# Patient Record
Sex: Female | Born: 1980 | Race: White | Hispanic: No | Marital: Married | Smoking: Former smoker
Health system: Southern US, Community
[De-identification: ages and names within clinical notes are randomized; demographics above are authoritative.]

## PROBLEM LIST (undated history)

## (undated) DIAGNOSIS — E039 Hypothyroidism, unspecified: Secondary | ICD-10-CM

## (undated) DIAGNOSIS — R569 Unspecified convulsions: Secondary | ICD-10-CM

## (undated) DIAGNOSIS — G4700 Insomnia, unspecified: Secondary | ICD-10-CM

## (undated) DIAGNOSIS — Z87442 Personal history of urinary calculi: Secondary | ICD-10-CM

## (undated) DIAGNOSIS — R519 Headache, unspecified: Secondary | ICD-10-CM

## (undated) DIAGNOSIS — C801 Malignant (primary) neoplasm, unspecified: Secondary | ICD-10-CM

## (undated) DIAGNOSIS — I499 Cardiac arrhythmia, unspecified: Secondary | ICD-10-CM

## (undated) DIAGNOSIS — I10 Essential (primary) hypertension: Secondary | ICD-10-CM

## (undated) DIAGNOSIS — E2749 Other adrenocortical insufficiency: Secondary | ICD-10-CM

## (undated) DIAGNOSIS — K219 Gastro-esophageal reflux disease without esophagitis: Secondary | ICD-10-CM

## (undated) DIAGNOSIS — J189 Pneumonia, unspecified organism: Secondary | ICD-10-CM

## (undated) HISTORY — PX: ABDOMINAL HYSTERECTOMY: SHX81

## (undated) HISTORY — PX: LEEP: SHX91

## (undated) HISTORY — DX: Hypothyroidism, unspecified: E03.9

## (undated) HISTORY — DX: Essential (primary) hypertension: I10

---

## 1999-11-05 ENCOUNTER — Ambulatory Visit: Admission: RE | Admit: 1999-11-05 | Discharge: 1999-11-05 | Payer: Self-pay | Admitting: Gynecologic Oncology

## 2001-01-19 ENCOUNTER — Encounter: Payer: Self-pay | Admitting: Internal Medicine

## 2001-01-19 ENCOUNTER — Ambulatory Visit (HOSPITAL_COMMUNITY): Admission: RE | Admit: 2001-01-19 | Discharge: 2001-01-19 | Payer: Self-pay | Admitting: Internal Medicine

## 2001-06-30 ENCOUNTER — Encounter: Payer: Self-pay | Admitting: *Deleted

## 2001-06-30 ENCOUNTER — Ambulatory Visit (HOSPITAL_COMMUNITY): Admission: RE | Admit: 2001-06-30 | Discharge: 2001-06-30 | Payer: Self-pay | Admitting: *Deleted

## 2001-09-21 ENCOUNTER — Ambulatory Visit (HOSPITAL_COMMUNITY): Admission: AD | Admit: 2001-09-21 | Discharge: 2001-09-21 | Payer: Self-pay | Admitting: *Deleted

## 2001-09-22 ENCOUNTER — Ambulatory Visit (HOSPITAL_COMMUNITY): Admission: RE | Admit: 2001-09-22 | Discharge: 2001-09-22 | Payer: Self-pay | Admitting: *Deleted

## 2001-10-25 ENCOUNTER — Inpatient Hospital Stay (HOSPITAL_COMMUNITY): Admission: AD | Admit: 2001-10-25 | Discharge: 2001-10-26 | Payer: Self-pay | Admitting: *Deleted

## 2002-04-21 HISTORY — PX: LEEP: SHX91

## 2002-08-25 ENCOUNTER — Other Ambulatory Visit: Admission: RE | Admit: 2002-08-25 | Discharge: 2002-08-25 | Payer: Self-pay | Admitting: *Deleted

## 2002-09-22 ENCOUNTER — Emergency Department (HOSPITAL_COMMUNITY): Admission: EM | Admit: 2002-09-22 | Discharge: 2002-09-23 | Payer: Self-pay | Admitting: Emergency Medicine

## 2002-09-22 ENCOUNTER — Other Ambulatory Visit: Admission: RE | Admit: 2002-09-22 | Discharge: 2002-09-22 | Payer: Self-pay | Admitting: *Deleted

## 2003-06-10 ENCOUNTER — Emergency Department (HOSPITAL_COMMUNITY): Admission: EM | Admit: 2003-06-10 | Discharge: 2003-06-10 | Payer: Self-pay | Admitting: Emergency Medicine

## 2003-09-28 ENCOUNTER — Other Ambulatory Visit: Admission: RE | Admit: 2003-09-28 | Discharge: 2003-09-28 | Payer: Self-pay | Admitting: Obstetrics and Gynecology

## 2004-07-05 ENCOUNTER — Other Ambulatory Visit: Admission: RE | Admit: 2004-07-05 | Discharge: 2004-07-05 | Payer: Self-pay | Admitting: Obstetrics and Gynecology

## 2005-01-20 ENCOUNTER — Ambulatory Visit (HOSPITAL_COMMUNITY): Admission: RE | Admit: 2005-01-20 | Discharge: 2005-01-20 | Payer: Self-pay | Admitting: Family Medicine

## 2007-04-02 ENCOUNTER — Ambulatory Visit (HOSPITAL_COMMUNITY): Admission: RE | Admit: 2007-04-02 | Discharge: 2007-04-02 | Payer: Self-pay | Admitting: Family Medicine

## 2010-09-06 NOTE — Discharge Summary (Signed)
[name], [name]                       ACCOUNT NO.:  [id number]   MEDICAL RECORD NO.:  [id number]                   PATIENT TYPE:  INP   LOCATION:  A416                                 FACILITY:  [facility]   PHYSICIAN:  Roylene Reason. Lisette Grinder, M.D.             DATE OF BIRTH:  Sep 26, 1980   DATE OF ADMISSION:  10/25/2001  DATE OF DISCHARGE:  10/26/2001                                 DISCHARGE SUMMARY   HISTORY OF PRESENT ILLNESS:  This is a 30 year old gravida 2, para 1 at [redacted]  weeks gestation who was seen in the office today complaining of preterm  labor and preterm uterine contractions.  She was referred to [facility] for evaluation and continued fetal monitoring.  The patient's  prenatal course had been otherwise uncomplicated with no prior history of  preterm labor, no prior history of premature cervical dilatation.  The  patient denies any vaginal discharge, any leakage of fluid.  She describes  good uterine activity.   PAST OB HISTORY:  Pertinent for one prior vaginal delivery without  complications.   PHYSICAL EXAMINATION:  GENERAL:  No acute distress.  VITAL SIGNS:  Temperature 98.5, pulse 90, respirations 20, blood pressure  105/60.  PELVIC:  Uterus is soft and nontender.  Fundal height is 34 cm.  She has  vertex presentation by Leopold's maneuvers.  No uterine tenderness is  solicited.  Normal external genitalia.  No lesions or ulcerations  identified.  No vaginal bleeding.  No leakage of fluid.  Cervix noted to be  2 cm dilated, -2 station, about 60% effaced.  EXTREMITIES:  Normal.   LABORATORIES:  External fetal monitor reveals only presence of uterine  irritability with very short duration uterine contractions of 15-30 seconds'  duration occurring very irregularly and very mild to palpation.  Fetal heart  rate is noted to be 140-150 with normal long-term variability.  Fetal heart  rate accelerations are noted.  No fetal heart rate decelerations are noted.  Reactive nonstress test, uterine irritability only.   ASSESSMENT/PLAN:  A [redacted] week gestation, preterm labor with premature cervical  dilatation.  The patient is restricted at this time to bed rest.  She has  not been administered any tocolytic medications but due to risk of preterm  delivery she is treated with 12 mg of IM betamethasone to be repeated in 24  hours' duration.  The patient is given strict instructions to follow up for  onset of vaginal bleeding or onset of any significant uterine activity  differing from what she appreciates right now.                                               Donald P. Lisette Grinder, M.D.    DPC/MEDQ  D:  12/05/2001  T:  12/06/2001  Job:  [id number]

## 2010-09-06 NOTE — Op Note (Signed)
[name], [name] NO.:  [id number]   MEDICAL RECORD NO.:  [id number]                   PATIENT TYPE:  EMS   LOCATION:  ED                                   FACILITY:  [facility]   PHYSICIAN:  Langley Gauss, M.D.                DATE OF BIRTH:  08/20/1980   DATE OF PROCEDURE:  09/23/2002  DATE OF DISCHARGE:                                 OPERATIVE REPORT   PREOPERATIVE DIAGNOSIS:  Vaginal bleeding status post LEEP.   POSTOPERATIVE DIAGNOSIS:  Vaginal bleeding status post LEEP.   PROCEDURE:  Examination of the cervix with placement of ligature sutures at  3 and 9 o'clock followed by cauterization of the bed of the cervix.   SURGEON:  Roylene Reason. Lisette Grinder, M.D.   ANESTHESIA:  Paracervical block placed by Dr. Roylene Reason. Lisette Grinder utilizing a  total of 20 cc of 0.5% bupivacaine with epinephrine placed at 2, 4, 8, and  10 o'clock.  In addition, the patient received LMA per anesthesia with  excellent results.   ESTIMATED BLOOD LOSS:  About 100 cc of clots are evacuated from the vaginal  vault after removal of the tampon.   OPERATIVE PROCEDURE:  The patient was taken to the operating room.  Vital  signs were stable.  The patient received appropriate IV sedation.  She was  placed in the lithotomy position, prepped and draped in the usual sterile  manner. Tampon is removed which is noted to be saturated with blood.  Clots  were removed from behind the tampon. The cervix is well-visualized.  The  patient has been sterilely prepped and draped utilizing Betadine solution.  The cervix is visualized.  The bed of the LEEP procedure is noted.  There  was noted to be a small amount of active bleeding occurring, but not any  excessive arterial bleeding noted.  At this point in time, #0 Vicryl pop-off  sutures are used to place a ligatory suture at 3 o'clock and at 9 o'clock on  the cervix to control major arterial supply.  Following the injection of the  bupivacaine  and epidural and placement of the ligatory sutures there is  noted to be minimal active bleeding.  The vascular bed is carefully examined  where the LEEP procedure had been performed and cauterization is performed  of the entire bed of the LEEP.  The endocervical canal is probed and noted  to be patent.  After bleeding is noted to be completely controlled with no  additional bleeding, the procedure is then terminated.  No Monsel's or  packing solution are placed.  The patient is, thereafter, taken to recovery  room on the second floor in stable condition.  Operative findings are  discussed with the patient's awaiting family.  No active bleeding is noted  to be occurring.  Vital signs are stable.  The patient is given the offer of  staying overnight versus being  discharged home, at this time with the  family.  The patient has elected to be discharged home with the family at  this time.                                               Langley Gauss, M.D.    DC/MEDQ  D:  09/23/2002  T:  09/23/2002  Job:  [id number]

## 2010-09-06 NOTE — H&P (Signed)
[facility]  Patient:    [name], [name] Visit Number: [id number] MRN: [id number]          Service Type: OBS Location: 4A A416 01 Attending Physician:  Jeri Cos. Dictated by:   Langley Gauss, M.D. Admit Date:  10/25/2001                           History and Physical  HISTORY OF PRESENT ILLNESS:  A 30 year old, Gravida 2, Para 1 at [redacted] weeks gestation was seen in the office for a scheduled visit today at which time, she was noted to be in prodromal labor with symptoms of uterine contractions as well as increased vaginal discharge.  The patients prenatal course has been uncomplicated.  Glucose tolerance test is normal at 124.  Rubella immune. A positive blood type.  She has had serial ultrasounds which have documented normal anatomic survey and adequate fetal growth.  PAST MEDICAL HISTORY:  She has one prior vaginal delivery on June 02, 1997, 6 pound 6 ounce infant.  No other medical or surgical history.  ALLERGIES:  She has no known drug allergies.  SOCIAL HISTORY:  The patient did smoke one pack per day at the onset of the pregnancy.  Father of the baby is named Elfida Shimada, works [employer]. The patient herself was employed at [employer].  The patient does plan bottle feeding.  She will be utilizing pediatrician on call.  She has not made any definitive statements regarding birth control options postpartum.  PHYSICAL EXAMINATION:  GENERAL:  Pleasant white female.  VITAL SIGNS:  Height is 5 feet 4 inches.  Prepregnancy weight 123, todays weight 148.  Blood pressure 124/76, pulse rate 80, respiratory rate 20.  HEENT:  Negative.  No adenopathy.  NECK:  Supple.  Thyroid not palpable.  LUNGS:  Clear.  CARDIOVASCULAR:  Regular rate and rhythm.  ABDOMEN:  Soft and nontender.  No surgical scars are identified.  She is vertex presentation by Leopolds maneuvers.  EXTREMITIES:  Normal with only trace  pretibial edema.  PELVIC:  Normal external genitalia.  No lesions or ulcerations identified.  No leakage of fluid or vaginal bleeding.  Cervix 4 cm dilated, 80% effaced, 0 station with vertex presentation confirmed. Fetal heart tones are auscultated in the 150s.  ASSESSMENT:  A 38-week intrauterine pregnancy in prodromal labor.  The patient referred to [facility] at this time for admission and amniotomy. Notably the patient does give vague history that prior infant was treated for group B Strep times 48 hours postpartum.  Thus patient to be treated during this course in labor with IV Ampicillin empirically. Dictated by:   Langley Gauss, M.D. Attending Physician:  Jeri Cos. DD:  10/25/01 TD:  10/25/01 Job: [id number]

## 2010-09-06 NOTE — Discharge Summary (Signed)
   NAMEBLOSSOM, CRUME NO.:  [id number]   MEDICAL RECORD NO.:  [id number]                   PATIENT TYPE:  EMS   LOCATION:  ED                                   FACILITY:  [facility]   PHYSICIAN:  Langley Gauss, M.D.                DATE OF BIRTH:  09-14-1980   DATE OF ADMISSION:  09/22/2002  DATE OF DISCHARGE:                                 DISCHARGE SUMMARY   The patient discharged to home from the recovery room on September 23, 2002.  She  was given instructions to follow up in the office in 1 week's time at which  time pathology results will be available. She is advised that some light  vaginal bleeding may occur for which she can use pads or tampons.   She does have a prescription for Vicoprofen for dysmenorrhea which she can  take, as needed, for any uterine cramping which may occur.  The patient can  return to work in 3 days time without restrictions. She is advised no  intercourse x1 weeks time.   HOSPITAL COURSE:  See previous dictations.  The patient initially evaluated  in the emergency room on September 22, 2002 and taken to the operating room where  the described procedure was performed on September 23, 2002.  Postoperatively the  patient did well.  She was discharged home September 23, 2002.                                               Langley Gauss, M.D.    DC/MEDQ  D:  09/23/2002  T:  09/23/2002  Job:  [id number]

## 2010-09-06 NOTE — H&P (Signed)
[name], NGU NO.:  [id number]   MEDICAL RECORD NO.:  [id number]                   PATIENT TYPE:  EMS   LOCATION:  ED                                   FACILITY:  [facility]   PHYSICIAN:  Langley Gauss, M.D.                DATE OF BIRTH:  17-Mar-1981   DATE OF ADMISSION:  09/22/2002  DATE OF DISCHARGE:                                HISTORY & PHYSICAL   HISTORY OF PRESENT ILLNESS:  The patient is evaluated and initially seen in  the emergency room on September 22, 2002.  Arrangements are made to take the  patient to surgery.  On September 22, 2002, a 30 year old gravida 3, para 3 with  complaints of vaginal bleeding post LEEP procedure today.  The patient had  had the LEEP procedure performed in the a.m. at which time there is  difficulty controlling bleeding.  The patient did have a vaginal packing  placed, was discharged to home with return visit in the office that  afternoon.  When the packing was removed in the afternoon, she was noted to  have no significant active bleeding and several small clots were evacuated,  and a tampon with Monsel solution in place.  The patient was advised to  leave this in until tonight at which time it could be removed and a new  tampon placed.  However, the patient states that after arriving at home she  saturated two pads through the tampon, began feeling cool, clammy and shaky,  somewhat unsteady on her feet.  Her sister-in-law is a nurse who evaluated  the patient.  There was noted, the impression of the patient and her family,  a large amount of bleeding.  The patient was noted to be sitting on the  bathroom floor in a pool of blood.  Thus, EMS was contacted and transported  the patient to the [facility] emergency room.   ALLERGIES:  No known drug allergies.   PAST MEDICAL HISTORY:  Three prior vaginal deliveries without complications.  Most recently Pap smear could not rule out high grade SIL, thus, indications  for the  LEEP procedure.   REVIEW OF SYSTEMS:  Pertinent for no prior bleeding disorders.  No blood  dyscrasia.  No easy bruisability.   PHYSICAL EXAMINATION:  GENERAL APPEARANCE:  The patient is in mild distress,  particularly the family feeling that there has been excessive complications  associated with the procedure.  VITAL SIGNS:  Blood pressure 107/64, pulse 66, respirations 22, temperature  99.9.  HEENT:  Mucous membranes are moist.  Thyroid is not palpable.  NECK:  Supple.  LUNGS:  Clear.  CARDIOVASCULAR:  Regular rate and rhythm.  ABDOMEN:  Soft and nontender.  No abdominal pelvic masses appreciated.  No  abdominal distention.  Pelvis examination has moderate amount of external  vaginal bleeding noted on the inner thighs.  The tampon from previous  procedure is  currently in place.   LABORATORY DATA:  Hemoglobin obtained in the emergency room 11.7, hematocrit  32.4, white count 16.3.   ASSESSMENT:  Patient with renewed problem with vaginal bleeding status post  loop electrocautery excision procedure.  Packing has already been tried in  the office, has been unsuccessful.  At this point in time, the patient is  to be taken to the operating room where better exposure can be obtained and  sutures placed paracervically to control the major arterial supply to the  cervix itself.  The patient and family agreed to proceed with having this  done rather than leaving against medical advise, and this will be performed  this p.m.                                                Langley Gauss, M.D.    DC/MEDQ  D:  09/23/2002  T:  09/23/2002  Job:  [id number]

## 2010-09-06 NOTE — H&P (Signed)
[facility]  Patient:    [name], [name]                      MRN: [id number] Adm. Date:  11/05/99 Attending:  Jonny Ruiz T. Kyla Balzarine, M.D. CC:         Dr. Melany Guernsey, [address],             [place],  [place]  [address]             [facility], [address], [place] [address]             Telford Nab, R.N.                         History and Physical  HISTORY OF PRESENT ILLNESS:  Ms. [name] is a 30 year old para 1, Caucasian woman, self-referred for second opinion regarding management of cervical dysplasia.  The patient had a Pap smear obtained in April compatible with high-grade SIL, moderate to severe dysplasia.  She was evaluated by Dr. 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 Magdalene River recommended serviellance at six-month intervals.  The patient denies genital condylomata or STDs. She is a tobacco smoker.  PAST MEDICAL HISTORY:  No major comorbidities.  NSVD x 1.  MEDICATIONS:  Depo-Provera.  ALLERGIES:  None.  PERSONAL/SOCIAL HISTORY:  Married.  One pack per day tobacco.  Occasional ethanol.  FAMILY MEDICAL HISTORY:  Maternal aunt with breast cancer but no other gynecologic or colon malignancy.  REVIEW OF SYSTEMS:  Negative for significant pelvic, GI, or GU problems.  The patient has been amenorrheic since beginning Depo-Provera.  PHYSICAL EXAMINATION:  VITAL SIGNS:  Blood pressure 90/68.  Weight 123 pounds and vital signs stable.  GENERAL:  The patient is alert and oriented x 3.  ABDOMEN:  Soft and benign.  EXTREMITIES:  Mobile without edema.  PELVIC EXAM:  External genitalia and BUS were normal to inspection and palpation.  The bladder and urethra well supported.  The cervix has no gross lesions and is mobile without tenderness.  Bimanual and rectovaginal examinations reveal no  parametrial induration, normal uterus and adnexal structures.  Colposcopy of the upper vagina and cervix was performed revealing a wide eversion with scattered islands of acido-white epithelium with scattered punctation but no vascular features suggesting high-grade dysplasia.  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  PLAN:  I had a lengthy discussion with the patient and her mother regarding cervical dysplasia.  The histologic diagnosis description is much more compatible with CIN-I than moderate to severe dysplasia and these lesions have a 70% chance of progression without any further intervention.  I recommended that the patient pursue smoking cessation.  She should have cytology followed at approximately six month intervals and I would repeat colposcopy should her Pap smear suggest worsening dysplasia or persistence of low-grade disease over more than two years.  Because her most recent Pap smear suggested high-grade SIL, I would probably move to a LEEP biopsy if subsequent Pap smears continued to suggest HGSIL. DD:  11/05/99 TD:  11/05/99 Job: [id number]

## 2010-09-06 NOTE — Discharge Summary (Signed)
[facility]  Patient:    [name], [name] Visit Number: [id number] MRN: [id number]          Service Type: OBS Location: 4A A416 01 Attending Physician:  Jeri Cos. Dictated by:   Langley Gauss, M.D. Admit Date:  10/25/2001 Discharge Date: 10/26/2001                             Discharge Summary  DISCHARGE DIAGNOSIS:  The patient is bottle feeding.  FOLLOWUP:  She will follow up in the office in four weeks time.  PERTINENT LABORATORY DATA:  Has A positive blood type. Admission hemoglobin and hematocrit of 9.4/26.3 with a white count of 11.6. Postpartum day #1, hemoglobin 9.2, hematocrit 25.7 with a white count of 18.0.  DISCHARGE INSTRUCTIONS:  The patient is given a copy of our standardized discharge instructions at the time of discharge.  DISCHARGE MEDICATIONS:  None.  In addition, infant circumcision is performed on October 26, 2001 or day of discharge.  HOSPITAL COURSE:  See previous dictations. The patient delivered very rapidly on October 25, 2001 without incident. Postpartum she did very well. She bonded well with the infant, had no postpartum complications, had excellent family support. Thus, the patient and infant are discharged on October 26, 2001, given a copy of the standardized discharge instructions. Dictated by:   Langley Gauss, M.D. Attending Physician:  Jeri Cos. DD:  10/29/00 TD:  11/01/01 Job: [id number]

## 2010-09-06 NOTE — Op Note (Signed)
[facility]  Patient:    [name], [name] Visit Number: [id number] MRN: [id number]          Service Type: OBS Location: 4A A416 01 Attending Physician:  Jeri Cos. Dictated by:   Langley Gauss, M.D. Admit Date:  10/25/2001 Discharge Date: 10/26/2001                             Operative Report  DATE OF DELIVERY: October 25, 2001  DIAGNOSES:  38-week intrauterine pregnancy for induction of labor.  DELIVERY FORM:  A spontaneous assisted vaginal delivery, 6 lb, 8.9 oz., female infant delivered over a midline episiotomy.  FINDINGS AT TIME OF DELIVERY:  Include a nuchal cord x2 without compression. Delivery performed by Dr. Roylene Reason. Lisette Grinder.  ESTIMATED BLOOD LOSS:  Less than 500 cc.  COMPLICATIONS:  None.  SPECIMENS:  Arterial cord gas and cord blood to pathology laboratory for analysis.  The placenta was noted to separate and deliver spontaneously.  It was examined and noted to be apparently intact with a three-vessel umbilical cord.  ANALGESIA FOR DELIVERY: The patient received only 30 cc of 1% lidocaine in the midline of the perineal body.  SUMMARY:  The patient at 38 weeks with a favorable cervix.  Was admitted for induction of labor after amniotomy was performed.  Clear amniotic fluid was noted.  Fetal scalp electrode was placed, which documented a reassuring fetal heart rate.  Thereafter, the patient progressed very rapidly along the labor curve to complete dilatation, after which time she had a strong urge to push. She was placed in the dorsolithotomy position, prepped and draped in the usual sterile manner.  The patient pushed very well during the short second stage of labor and delivered in a direct OA position with distention of the perineum. A small midline episiotomy had been performed.  The infant delivered in a direct OA position over this midline episiotomy without extension.  The nuchal cord x2 was reduced without  difficulty.  Renewed expulsive efforts resulted in spontaneous rotation to a left anterior shoulder position, gentle downward traction, combined with expulsive efforts resulted in delivery of the anterior shoulder without difficulty, as well as the remainder of the infant without difficulty.  A spontaneous, vigorous, breathe and cry was noted.  The umbilical cord was removed towards the infant.  The cord was doubly clamped and cut, and the infant was handed to the awaiting nursing staff for assessment.  Arterial cord gas and cord blood were then obtained.  Gentle traction on the umbilical cord resulted in separation, which upon examination appears to be the intact three-vessel placenta.  Excellent uterine tone was achieved following delivery, utilizing a bimanual massage of the uterus. Examination of the genital tract revealed no lacerations.  Specifically, the cervix and periurethral area was noted to be intact.  The midline episiotomy was noted to have not extended.  This was easily repaired utilizing 0 chromic in a running locked fashion of the vaginal mucosa, followed by a two-layer closure with 0 chromic on the perineal body.  The patient tolerated the delivery very well.  She was taken out of the dorsolithotomy position and allowed to bond with the infant. Dictated by:   Langley Gauss, M.D. Attending Physician:  Jeri Cos. DD:  10/29/01 TD:  11/01/01 Job: [id number]

## 2010-11-29 ENCOUNTER — Ambulatory Visit (HOSPITAL_COMMUNITY)
Admission: RE | Admit: 2010-11-29 | Discharge: 2010-11-29 | Disposition: A | Discharge status: Home / Self Care | Payer: 59 | Source: Ambulatory Visit | Attending: Family Medicine | Admitting: Family Medicine

## 2010-11-29 ENCOUNTER — Other Ambulatory Visit (HOSPITAL_COMMUNITY): Payer: Self-pay | Admitting: Family Medicine

## 2010-11-29 DIAGNOSIS — J069 Acute upper respiratory infection, unspecified: Secondary | ICD-10-CM

## 2010-11-29 DIAGNOSIS — R0602 Shortness of breath: Secondary | ICD-10-CM | POA: Insufficient documentation

## 2010-11-29 DIAGNOSIS — R05 Cough: Secondary | ICD-10-CM

## 2010-11-29 DIAGNOSIS — R059 Cough, unspecified: Secondary | ICD-10-CM | POA: Insufficient documentation

## 2013-05-13 ENCOUNTER — Ambulatory Visit (HOSPITAL_COMMUNITY)
Admission: RE | Admit: 2013-05-13 | Discharge: 2013-05-13 | Disposition: A | Discharge status: Home / Self Care | Payer: 59 | Source: Ambulatory Visit | Attending: Family Medicine | Admitting: Family Medicine

## 2013-05-13 ENCOUNTER — Other Ambulatory Visit (HOSPITAL_COMMUNITY): Payer: Self-pay | Admitting: Family Medicine

## 2013-05-13 DIAGNOSIS — R0609 Other forms of dyspnea: Secondary | ICD-10-CM | POA: Insufficient documentation

## 2013-05-13 DIAGNOSIS — R042 Hemoptysis: Secondary | ICD-10-CM

## 2013-05-13 DIAGNOSIS — R002 Palpitations: Secondary | ICD-10-CM | POA: Insufficient documentation

## 2013-05-13 DIAGNOSIS — R0989 Other specified symptoms and signs involving the circulatory and respiratory systems: Principal | ICD-10-CM | POA: Insufficient documentation

## 2013-12-05 NOTE — H&P (Signed)
[name], [name]             ACCOUNT NO.:  [id number]  MEDICAL RECORD NO.:  [id number]  LOCATION:                                 FACILITY:  PHYSICIAN:  Ralene Bathe. Matthew Hughes, [name].DATE OF BIRTH:  1980-08-22  DATE OF ADMISSION:  12/12/2013 DATE OF DISCHARGE:                             HISTORY & PHYSICAL   CHIEF COMPLAINT:  Dysmenorrhea and menorrhagia.  HPI:  A 33 year old, G2, P2, currently using condoms, this patient was evaluated for menorrhagia in 2014, with sonohysterogram in our office that showed a retroflexed uterus.  Thickened myometrium consistent with probable adenomyosis and also a 5-mm polypoid mass noted within the fundal cavity.  At that time, we had discussed a number of treatment options, all of which she had declined, but over the last year, she has developed increasing pain with her period and increased menstrual flow. She presents now for LAVH, bilateral salpingectomy.  This procedure including specific risks related to bleeding infection, adjacent organ injury, the possible need to complete surgery by open technique, all discussed with her.  The rationale for salpingectomy at the time of hysterectomy to reduce later life risk of ovarian cancer discussed with her also.  Note that she has had LEEP in the past.  Her last Pap August 15th was normal.  PAST MEDICAL HISTORY:  Allergies:  ALBUTEROL.  CURRENT MEDICATIONS:  Lamictal, Lopressor, Ambien p.r.n.  OBSTETRICAL HISTORY:  Two vaginal deliveries.  REVIEW OF SYSTEMS:  Significant for prior history of abnormal Pap, history of UTI, hypertension.  Barbara Grills, MD is her PCP.  SOCIAL HISTORY:  Denies alcohol, tobacco, or drug use.  She is married.  PHYSICAL EXAMINATION:  VITAL SIGNS:  Temp 98.2, blood pressure 118/80. HEENT:  Unremarkable. NECK:  Supple without masses. LUNGS:  Clear. CARDIOVASCULAR:  Regular rate and rhythm without murmurs, rubs, gallops sounds. BREASTS:  Without  masses. ABDOMEN:  Soft, flat, nontender. GENITALIA:  Vulva, vagina, cervix normal.  Uterus mid position, normal size.  Adnexa negative. EXTREMITIES:  Unremarkable. NEUROLOGIC:  Unremarkable.  IMPRESSION: 1. Dysmenorrhea/menorrhagia. 2. History of by ultrasound rather possible adenomyosis, small     endometrial polyp. 3. History of loop electrosurgical excision procedure in the past.  PLAN:  LAVH bilateral salpingectomy. Procedure and risks discussed as above.     [name], [name].     RMH/MEDQ  D:  12/05/2013  T:  12/05/2013  Job:  [id number]

## 2013-12-05 NOTE — H&P (Signed)
[facility]  DICTATION # [phone] CSN# [id number]   Margarette Asal, MD 12/05/2013 9:21 AM

## 2013-12-09 ENCOUNTER — Encounter (HOSPITAL_COMMUNITY)
Admission: RE | Admit: 2013-12-09 | Discharge: 2013-12-09 | Disposition: A | Discharge status: Home / Self Care | Payer: 59 | Source: Ambulatory Visit | Attending: Obstetrics and Gynecology | Admitting: Obstetrics and Gynecology

## 2013-12-09 ENCOUNTER — Encounter (HOSPITAL_COMMUNITY): Payer: Self-pay

## 2013-12-09 DIAGNOSIS — Z87891 Personal history of nicotine dependence: Secondary | ICD-10-CM | POA: Diagnosis not present

## 2013-12-09 DIAGNOSIS — N946 Dysmenorrhea, unspecified: Secondary | ICD-10-CM | POA: Diagnosis not present

## 2013-12-09 DIAGNOSIS — N854 Malposition of uterus: Secondary | ICD-10-CM | POA: Diagnosis not present

## 2013-12-09 DIAGNOSIS — N920 Excessive and frequent menstruation with regular cycle: Secondary | ICD-10-CM | POA: Diagnosis present

## 2013-12-09 DIAGNOSIS — N840 Polyp of corpus uteri: Secondary | ICD-10-CM | POA: Diagnosis not present

## 2013-12-09 HISTORY — DX: Insomnia, unspecified: G47.00

## 2013-12-09 HISTORY — DX: Cardiac arrhythmia, unspecified: I49.9

## 2013-12-09 HISTORY — DX: Unspecified convulsions: R56.9

## 2013-12-09 LAB — CBC
HEMATOCRIT: 35.8 % — AB (ref 36.0–46.0)
HEMOGLOBIN: 12.2 g/dL (ref 12.0–15.0)
MCH: 29.6 pg (ref 26.0–34.0)
MCHC: 34.1 g/dL (ref 30.0–36.0)
MCV: 86.9 fL (ref 78.0–100.0)
Platelets: 274 10*3/uL (ref 150–400)
RBC: 4.12 MIL/uL (ref 3.87–5.11)
RDW: 12.9 % (ref 11.5–15.5)
WBC: 9.7 10*3/uL (ref 4.0–10.5)

## 2013-12-09 NOTE — Patient Instructions (Signed)
[place]  12/09/2013   Your procedure is scheduled on:  12/12/13  Enter through the Main Entrance of [facility] at [place] up the phone at the desk and dial [phone].   Call this number if you have problems the morning of surgery: [phone]   Remember:   Do not eat food:After Midnight.  Do not drink clear liquids: After Midnight.  Take these medicines the morning of surgery with A SIP OF WATER: NA   Do not wear jewelry, make-up or nail polish.  Do not wear lotions, powders, or perfumes. You may wear deodorant.  Do not shave 48 hours prior to surgery.  Do not bring valuables to the hospital.  [facility] is not   responsible for any belongings or valuables brought to the hospital.  Contacts, dentures or bridgework may not be worn into surgery.  Leave suitcase in the car. After surgery it may be brought to your room.  For patients admitted to the hospital, checkout time is 11:00 AM the day of              discharge.   Patients discharged the day of surgery will not be allowed to drive             home.  Name and phone number of your driver: NA  Special Instructions:      Please read over the following fact sheets that you were given:   Surgical Site Infection Prevention

## 2013-12-11 MED ORDER — DEXTROSE 5 % IV SOLN
2.0000 g | INTRAVENOUS | Status: AC
  Administered 2013-12-12: 2 g via INTRAVENOUS
  Filled 2013-12-11: qty 2

## 2013-12-12 ENCOUNTER — Encounter (HOSPITAL_COMMUNITY)
Admission: RE | Disposition: A | Discharge status: Home / Self Care | Payer: Self-pay | Source: Ambulatory Visit | Attending: Obstetrics and Gynecology

## 2013-12-12 ENCOUNTER — Ambulatory Visit (HOSPITAL_COMMUNITY): Payer: 59 | Admitting: Anesthesiology

## 2013-12-12 ENCOUNTER — Encounter (HOSPITAL_COMMUNITY): Payer: Self-pay | Admitting: *Deleted

## 2013-12-12 ENCOUNTER — Observation Stay (HOSPITAL_COMMUNITY)
Admission: RE | Admit: 2013-12-12 | Discharge: 2013-12-13 | Disposition: A | Discharge status: Home / Self Care | Payer: 59 | Source: Ambulatory Visit | Attending: Obstetrics and Gynecology | Admitting: Obstetrics and Gynecology

## 2013-12-12 ENCOUNTER — Encounter (HOSPITAL_COMMUNITY): Payer: 59 | Admitting: Anesthesiology

## 2013-12-12 DIAGNOSIS — N946 Dysmenorrhea, unspecified: Secondary | ICD-10-CM | POA: Insufficient documentation

## 2013-12-12 DIAGNOSIS — N920 Excessive and frequent menstruation with regular cycle: Principal | ICD-10-CM | POA: Insufficient documentation

## 2013-12-12 DIAGNOSIS — Z87891 Personal history of nicotine dependence: Secondary | ICD-10-CM | POA: Insufficient documentation

## 2013-12-12 DIAGNOSIS — N854 Malposition of uterus: Secondary | ICD-10-CM | POA: Insufficient documentation

## 2013-12-12 DIAGNOSIS — N840 Polyp of corpus uteri: Secondary | ICD-10-CM | POA: Insufficient documentation

## 2013-12-12 HISTORY — PX: LAPAROSCOPIC ASSISTED VAGINAL HYSTERECTOMY: SHX5398

## 2013-12-12 HISTORY — PX: BILATERAL SALPINGECTOMY: SHX5743

## 2013-12-12 LAB — PREGNANCY, URINE: Preg Test, Ur: NEGATIVE

## 2013-12-12 SURGERY — HYSTERECTOMY, VAGINAL, LAPAROSCOPY-ASSISTED
Anesthesia: General

## 2013-12-12 MED ORDER — HYDROMORPHONE HCL PF 1 MG/ML IJ SOLN
INTRAMUSCULAR | Status: AC
  Administered 2013-12-12: 0.5 mg via INTRAVENOUS
  Filled 2013-12-12: qty 1

## 2013-12-12 MED ORDER — LACTATED RINGERS IV SOLN
INTRAVENOUS | Status: DC
  Administered 2013-12-12 (×2): via INTRAVENOUS

## 2013-12-12 MED ORDER — ONDANSETRON HCL 4 MG/2ML IJ SOLN
4.0000 mg | Freq: Four times a day (QID) | INTRAMUSCULAR | Status: DC | PRN
  Filled 2013-12-12 (×2): qty 2

## 2013-12-12 MED ORDER — MORPHINE SULFATE (PF) 1 MG/ML IV SOLN
INTRAVENOUS | Status: DC
  Administered 2013-12-12: 1 mg via INTRAVENOUS
  Administered 2013-12-12: 2 mg via INTRAVENOUS
  Administered 2013-12-12: 11:00:00 via INTRAVENOUS
  Administered 2013-12-12: 3 mg via INTRAVENOUS
  Filled 2013-12-12: qty 25

## 2013-12-12 MED ORDER — GLYCOPYRROLATE 0.2 MG/ML IJ SOLN
INTRAMUSCULAR | Status: DC | PRN
  Administered 2013-12-12: .6 mg via INTRAVENOUS

## 2013-12-12 MED ORDER — KETOROLAC TROMETHAMINE 30 MG/ML IJ SOLN
INTRAMUSCULAR | Status: DC | PRN
  Administered 2013-12-12: 30 mg via INTRAVENOUS

## 2013-12-12 MED ORDER — BUTORPHANOL TARTRATE 1 MG/ML IJ SOLN: 1.0000 mg | INTRAMUSCULAR | Status: DC | PRN

## 2013-12-12 MED ORDER — DIPHENHYDRAMINE HCL 12.5 MG/5ML PO ELIX
12.5000 mg | ORAL_SOLUTION | Freq: Four times a day (QID) | ORAL | Status: DC | PRN
  Filled 2013-12-12: qty 5

## 2013-12-12 MED ORDER — DEXAMETHASONE SODIUM PHOSPHATE 10 MG/ML IJ SOLN
INTRAMUSCULAR | Status: DC | PRN
  Administered 2013-12-12: 4 mg via INTRAVENOUS

## 2013-12-12 MED ORDER — ROCURONIUM BROMIDE 100 MG/10ML IV SOLN
INTRAVENOUS | Status: AC
  Filled 2013-12-12: qty 1

## 2013-12-12 MED ORDER — KETOROLAC TROMETHAMINE 30 MG/ML IJ SOLN
30.0000 mg | Freq: Four times a day (QID) | INTRAMUSCULAR | Status: DC
  Administered 2013-12-12: 30 mg via INTRAVENOUS
  Filled 2013-12-12: qty 1

## 2013-12-12 MED ORDER — ONDANSETRON HCL 4 MG/2ML IJ SOLN
INTRAMUSCULAR | Status: DC | PRN
  Administered 2013-12-12: 4 mg via INTRAVENOUS

## 2013-12-12 MED ORDER — ONDANSETRON HCL 4 MG/2ML IJ SOLN
4.0000 mg | Freq: Four times a day (QID) | INTRAMUSCULAR | Status: DC | PRN
  Administered 2013-12-12: 4 mg via INTRAVENOUS

## 2013-12-12 MED ORDER — HYDROMORPHONE HCL PF 1 MG/ML IJ SOLN
INTRAMUSCULAR | Status: AC
  Filled 2013-12-12: qty 1

## 2013-12-12 MED ORDER — NALOXONE HCL 0.4 MG/ML IJ SOLN: 0.4000 mg | INTRAMUSCULAR | Status: DC | PRN

## 2013-12-12 MED ORDER — DEXTROSE IN LACTATED RINGERS 5 % IV SOLN
INTRAVENOUS | Status: DC
Start: 2013-12-12 — End: 2013-12-13
  Administered 2013-12-12 – 2013-12-13 (×4): via INTRAVENOUS

## 2013-12-12 MED ORDER — PROPOFOL 10 MG/ML IV BOLUS
INTRAVENOUS | Status: DC | PRN
  Administered 2013-12-12: 160 mg via INTRAVENOUS

## 2013-12-12 MED ORDER — ACETAMINOPHEN 160 MG/5ML PO SOLN
ORAL | Status: AC
  Administered 2013-12-12: 975 mg via ORAL
  Filled 2013-12-12: qty 40.6

## 2013-12-12 MED ORDER — ZOLPIDEM TARTRATE 5 MG PO TABS
10.0000 mg | ORAL_TABLET | Freq: Every evening | ORAL | Status: DC | PRN
  Administered 2013-12-12: 10 mg via ORAL
  Filled 2013-12-12: qty 2

## 2013-12-12 MED ORDER — LIDOCAINE HCL (CARDIAC) 20 MG/ML IV SOLN
INTRAVENOUS | Status: DC | PRN
  Administered 2013-12-12: 80 mg via INTRAVENOUS

## 2013-12-12 MED ORDER — ONDANSETRON HCL 4 MG/2ML IJ SOLN
4.0000 mg | Freq: Once | INTRAMUSCULAR | Status: AC
  Administered 2013-12-12: 4 mg via INTRAVENOUS

## 2013-12-12 MED ORDER — ONDANSETRON HCL 4 MG PO TABS: 4.0000 mg | ORAL_TABLET | Freq: Four times a day (QID) | ORAL | Status: DC | PRN

## 2013-12-12 MED ORDER — LIDOCAINE HCL (CARDIAC) 20 MG/ML IV SOLN
INTRAVENOUS | Status: AC
  Filled 2013-12-12: qty 5

## 2013-12-12 MED ORDER — SCOPOLAMINE 1 MG/3DAYS TD PT72
MEDICATED_PATCH | TRANSDERMAL | Status: AC
  Administered 2013-12-12: 1.5 mg via TRANSDERMAL
  Filled 2013-12-12: qty 1

## 2013-12-12 MED ORDER — PROMETHAZINE HCL 25 MG/ML IJ SOLN: 6.2500 mg | INTRAMUSCULAR | Status: DC | PRN

## 2013-12-12 MED ORDER — KETOROLAC TROMETHAMINE 30 MG/ML IJ SOLN
INTRAMUSCULAR | Status: AC
  Filled 2013-12-12: qty 1

## 2013-12-12 MED ORDER — IBUPROFEN 800 MG PO TABS
800.0000 mg | ORAL_TABLET | Freq: Three times a day (TID) | ORAL | Status: DC | PRN
  Administered 2013-12-13 (×2): 800 mg via ORAL
  Filled 2013-12-12 (×2): qty 1

## 2013-12-12 MED ORDER — BUPIVACAINE HCL (PF) 0.25 % IJ SOLN
INTRAMUSCULAR | Status: AC
  Filled 2013-12-12: qty 30

## 2013-12-12 MED ORDER — FENTANYL CITRATE 0.05 MG/ML IJ SOLN
INTRAMUSCULAR | Status: DC | PRN
  Administered 2013-12-12 (×3): 50 ug via INTRAVENOUS
  Administered 2013-12-12: 100 ug via INTRAVENOUS

## 2013-12-12 MED ORDER — MEPERIDINE HCL 25 MG/ML IJ SOLN: 6.2500 mg | INTRAMUSCULAR | Status: DC | PRN

## 2013-12-12 MED ORDER — HYDROMORPHONE HCL PF 1 MG/ML IJ SOLN
0.2500 mg | INTRAMUSCULAR | Status: DC | PRN
  Administered 2013-12-12 (×3): 0.5 mg via INTRAVENOUS

## 2013-12-12 MED ORDER — SODIUM CHLORIDE 0.9 % IJ SOLN: 9.0000 mL | INTRAMUSCULAR | Status: DC | PRN

## 2013-12-12 MED ORDER — SCOPOLAMINE 1 MG/3DAYS TD PT72
1.0000 | MEDICATED_PATCH | Freq: Once | TRANSDERMAL | Status: DC
  Administered 2013-12-12: 1.5 mg via TRANSDERMAL

## 2013-12-12 MED ORDER — MENTHOL 3 MG MT LOZG
1.0000 | LOZENGE | OROMUCOSAL | Status: DC | PRN
  Filled 2013-12-12 (×2): qty 9

## 2013-12-12 MED ORDER — GLYCOPYRROLATE 0.2 MG/ML IJ SOLN
INTRAMUSCULAR | Status: AC
  Filled 2013-12-12: qty 3

## 2013-12-12 MED ORDER — NEOSTIGMINE METHYLSULFATE 10 MG/10ML IV SOLN
INTRAVENOUS | Status: DC | PRN
  Administered 2013-12-12: 4 mg via INTRAVENOUS

## 2013-12-12 MED ORDER — MIDAZOLAM HCL 2 MG/2ML IJ SOLN
INTRAMUSCULAR | Status: AC
  Filled 2013-12-12: qty 2

## 2013-12-12 MED ORDER — DEXAMETHASONE SODIUM PHOSPHATE 4 MG/ML IJ SOLN
INTRAMUSCULAR | Status: AC
  Filled 2013-12-12: qty 1

## 2013-12-12 MED ORDER — HYDROMORPHONE HCL PF 1 MG/ML IJ SOLN
INTRAMUSCULAR | Status: DC | PRN
  Administered 2013-12-12: 1 mg via INTRAVENOUS

## 2013-12-12 MED ORDER — MIDAZOLAM HCL 2 MG/2ML IJ SOLN
INTRAMUSCULAR | Status: DC | PRN
  Administered 2013-12-12: 2 mg via INTRAVENOUS

## 2013-12-12 MED ORDER — KETOROLAC TROMETHAMINE 30 MG/ML IJ SOLN: 15.0000 mg | Freq: Once | INTRAMUSCULAR | Status: DC | PRN

## 2013-12-12 MED ORDER — SODIUM CHLORIDE 0.9 % IJ SOLN
INTRAMUSCULAR | Status: DC | PRN
  Administered 2013-12-12: 10 mL

## 2013-12-12 MED ORDER — DIPHENHYDRAMINE HCL 50 MG/ML IJ SOLN: 12.5000 mg | Freq: Four times a day (QID) | INTRAMUSCULAR | Status: DC | PRN

## 2013-12-12 MED ORDER — NEOSTIGMINE METHYLSULFATE 10 MG/10ML IV SOLN
INTRAVENOUS | Status: AC
  Filled 2013-12-12: qty 1

## 2013-12-12 MED ORDER — METOPROLOL TARTRATE 25 MG PO TABS
25.0000 mg | ORAL_TABLET | Freq: Every day | ORAL | Status: DC
  Filled 2013-12-12 (×3): qty 1

## 2013-12-12 MED ORDER — KETOROLAC TROMETHAMINE 30 MG/ML IJ SOLN: 30.0000 mg | Freq: Four times a day (QID) | INTRAMUSCULAR | Status: DC

## 2013-12-12 MED ORDER — ROCURONIUM BROMIDE 100 MG/10ML IV SOLN
INTRAVENOUS | Status: DC | PRN
  Administered 2013-12-12: 40 mg via INTRAVENOUS

## 2013-12-12 MED ORDER — BUPIVACAINE HCL (PF) 0.25 % IJ SOLN
INTRAMUSCULAR | Status: DC | PRN
  Administered 2013-12-12: 5 mL

## 2013-12-12 MED ORDER — ACETAMINOPHEN 160 MG/5ML PO SOLN
975.0000 mg | Freq: Once | ORAL | Status: AC
  Administered 2013-12-12: 975 mg via ORAL

## 2013-12-12 MED ORDER — LACTATED RINGERS IR SOLN
Status: DC | PRN
  Administered 2013-12-12: 3000 mL

## 2013-12-12 MED ORDER — FENTANYL CITRATE 0.05 MG/ML IJ SOLN
INTRAMUSCULAR | Status: AC
  Filled 2013-12-12: qty 5

## 2013-12-12 MED ORDER — KETOROLAC TROMETHAMINE 30 MG/ML IJ SOLN: 30.0000 mg | Freq: Once | INTRAMUSCULAR | Status: DC

## 2013-12-12 MED ORDER — OXYCODONE-ACETAMINOPHEN 5-325 MG PO TABS
1.0000 | ORAL_TABLET | ORAL | Status: DC | PRN
  Administered 2013-12-12: 1 via ORAL
  Administered 2013-12-13: 2 via ORAL
  Administered 2013-12-13: 1 via ORAL
  Administered 2013-12-13: 2 via ORAL
  Filled 2013-12-12 (×2): qty 1
  Filled 2013-12-12 (×2): qty 2

## 2013-12-12 MED ORDER — ONDANSETRON HCL 4 MG/2ML IJ SOLN
INTRAMUSCULAR | Status: AC
  Filled 2013-12-12: qty 2

## 2013-12-12 MED ORDER — TIZANIDINE HCL 4 MG PO CAPS: 4.0000 mg | ORAL_CAPSULE | Freq: Three times a day (TID) | ORAL | Status: DC | PRN

## 2013-12-12 MED ORDER — PROPOFOL 10 MG/ML IV EMUL
INTRAVENOUS | Status: AC
  Filled 2013-12-12: qty 20

## 2013-12-12 MED ORDER — TIZANIDINE HCL 4 MG PO TABS
4.0000 mg | ORAL_TABLET | Freq: Three times a day (TID) | ORAL | Status: DC | PRN
  Filled 2013-12-12: qty 1

## 2013-12-12 MED ORDER — LAMOTRIGINE 100 MG PO TABS
100.0000 mg | ORAL_TABLET | Freq: Every day | ORAL | Status: DC
  Administered 2013-12-12: 100 mg via ORAL
  Filled 2013-12-12 (×3): qty 1

## 2013-12-12 SURGICAL SUPPLY — 44 items
ADH SKN CLS APL DERMABOND .7 (GAUZE/BANDAGES/DRESSINGS) ×2
BLADE SURG 10 STRL SS (BLADE) ×3 IMPLANT
BLADE SURG 11 STRL SS (BLADE) ×6 IMPLANT
CATH ROBINSON RED A/P 16FR (CATHETERS) ×1 IMPLANT
CLOTH BEACON ORANGE TIMEOUT ST (SAFETY) ×3 IMPLANT
CONT PATH 16OZ SNAP LID 3702 (MISCELLANEOUS) ×3 IMPLANT
COVER TABLE BACK 60X90 (DRAPES) ×3 IMPLANT
DECANTER SPIKE VIAL GLASS SM (MISCELLANEOUS) ×1 IMPLANT
DERMABOND ADVANCED (GAUZE/BANDAGES/DRESSINGS) ×1
DERMABOND ADVANCED .7 DNX12 (GAUZE/BANDAGES/DRESSINGS) ×4 IMPLANT
DRSG COVADERM PLUS 2X2 (GAUZE/BANDAGES/DRESSINGS) ×5 IMPLANT
DRSG OPSITE POSTOP 3X4 (GAUZE/BANDAGES/DRESSINGS) ×2 IMPLANT
DURAPREP 26ML APPLICATOR (WOUND CARE) ×3 IMPLANT
ELECT LIGASURE SHORT 9 REUSE (ELECTRODE) ×3 IMPLANT
ELECT REM PT RETURN 9FT ADLT (ELECTROSURGICAL) ×3
ELECTRODE REM PT RTRN 9FT ADLT (ELECTROSURGICAL) ×2 IMPLANT
GLOVE BIO SURGEON STRL SZ7 (GLOVE) ×6 IMPLANT
GLOVE BIOGEL PI IND STRL 6.5 (GLOVE) ×2 IMPLANT
GLOVE BIOGEL PI IND STRL 7.0 (GLOVE) ×4 IMPLANT
GLOVE BIOGEL PI INDICATOR 6.5 (GLOVE) ×1
GLOVE BIOGEL PI INDICATOR 7.0 (GLOVE) ×2
GLOVE ECLIPSE 7.0 STRL STRAW (GLOVE) ×3 IMPLANT
GLOVE SURG ORTHO 8.0 STRL STRW (GLOVE) ×6 IMPLANT
GLOVE SURG SS PI 7.0 STRL IVOR (GLOVE) ×6 IMPLANT
GOWN STRL REUS W/ TWL LRG LVL3 (GOWN DISPOSABLE) ×14 IMPLANT
GOWN STRL REUS W/TWL LRG LVL3 (GOWN DISPOSABLE) ×21
NEEDLE INSUFFLATION 120MM (ENDOMECHANICALS) ×3 IMPLANT
NS IRRIG 1000ML POUR BTL (IV SOLUTION) ×3 IMPLANT
PACK LAVH (CUSTOM PROCEDURE TRAY) ×3 IMPLANT
PROTECTOR NERVE ULNAR (MISCELLANEOUS) ×4 IMPLANT
SEALER TISSUE G2 CVD JAW 45CM (ENDOMECHANICALS) ×3 IMPLANT
SET IRRIG TUBING LAPAROSCOPIC (IRRIGATION / IRRIGATOR) ×1 IMPLANT
SUT MON AB 2-0 CT1 36 (SUTURE) ×6 IMPLANT
SUT VIC AB 0 CT1 18XCR BRD8 (SUTURE) ×6 IMPLANT
SUT VIC AB 0 CT1 36 (SUTURE) ×3 IMPLANT
SUT VIC AB 0 CT1 8-18 (SUTURE) ×9
SUT VICRYL 0 TIES 12 18 (SUTURE) ×3 IMPLANT
SUT VICRYL 4-0 PS2 18IN ABS (SUTURE) ×3 IMPLANT
TOWEL OR 17X24 6PK STRL BLUE (TOWEL DISPOSABLE) ×6 IMPLANT
TRAY FOLEY CATH 14FR (SET/KITS/TRAYS/PACK) ×3 IMPLANT
TROCAR OPTI TIP 5M 100M (ENDOMECHANICALS) ×3 IMPLANT
TROCAR XCEL DIL TIP R 11M (ENDOMECHANICALS) ×3 IMPLANT
WARMER LAPAROSCOPE (MISCELLANEOUS) ×3 IMPLANT
WATER STERILE IRR 1000ML POUR (IV SOLUTION) ×3 IMPLANT

## 2013-12-12 NOTE — Progress Notes (Signed)
The patient was re-examined with no change in status 

## 2013-12-12 NOTE — Anesthesia Postprocedure Evaluation (Signed)
  Anesthesia Post-op Note  Patient: [name]  Procedure(s) Performed: Procedure(s): LAPAROSCOPIC ASSISTED VAGINAL HYSTERECTOMY (N/A) BILATERAL SALPINGECTOMY (Bilateral)  Patient Location: [facility] Unit  Anesthesia Type:General  Level of Consciousness: awake, alert , oriented and patient cooperative  Airway and Oxygen Therapy: Patient Spontanous Breathing, spo2 98%  Post-op Pain: moderate  Post-op Assessment: Post-op Vital signs reviewed, Patient's Cardiovascular Status Stable, Respiratory Function Stable, Patent Airway and NAUSEA AND VOMITING PRESENT  Post-op Vital Signs: Reviewed and stable  Last Vitals:  Filed Vitals:   12/12/13 1406  BP:   Pulse:   Temp:   Resp: 18    Complications: No apparent anesthesia complications

## 2013-12-12 NOTE — Op Note (Signed)
Preoperative diagnosis: Dysmenorrhea, menorrhagia  Postoperative diagnosis: Same  Procedure: LAVH, bilateral salpingectomy  Surgeon: Matthew Saras  Assistant: Low  EBL: 400 cc  Specimens removed: Uterus, bilateral tubes, to pathology  Drains: Foley, to straight drain  Procedure and findings:  The patient taken the operating room after an adequate level of general anesthesia was obtained with the patient's legs in stirrups the perineum and vagina were prepped and draped in the usual fashion for LAVH. The bladder was drained, EUA was carried out uterus was midposition upper limit of normal size, mobile adnexa negative. Hulka tenaculum position at that point. Appropriate timeout to our to been taken. Attention directed to the abdomen the subumbilical area was infiltrated with quarter percent Marcaine plain small incision was made, the varies needle was introduced without difficulty, its intra-abdominal position was verified by pressure water testing. After 2-1/2 L pneumoperitoneum syncopated, lap scopic trocar and sleeve were then introduced that difficulty. There was no evidence of any bleeding or trauma. 3 finger breaths above the symphysis in the midline a 5 mm trocar was inserted under direct visualization. Review the pelvic findings revealed that the uterus is upper limit normal size had a mottled appearance consistent with possible adenomyosis bilateral adnexa cul-de-sac and upper abdomen unremarkable.  The uterus on traction starting on the right tube was grasped with an atraumatic grasper in the in seal device was then used to divide the mesial salpinx up to the origin of the tube. The uterus ovarian pedicle was then coagulated down to and including the right round ligament on that side with excellent hemostasis. The exact same repeated on the opposite side portion of the left fimbria was not removed since it was agglutinated to the ovary and very difficult to separate. Once this was completed the  vaginal portion the procedure was started   The legs were extended weighted speculum was positioned cervix grasped with tenaculum cervical vaginal mucosa was incised circumferentially posterior culdotomy performed without difficulty. The bladder was advanced superiorly with sharp and blunt dissection until the anterior peritoneal reflection could be identified, this was entered sharply and a retractor then used to gently elevate the bladder out of the field. Using the LigaSure device the uterosacral ligament, cardinal ligament uterine basket or pedicles and upper broad ligament pedicles were coagulated and divided. The fundus of the uterus is in delivered posteriorly remaining pedicles coagulated and divided. Posterior vaginal cuff was then closed with a locked 2-0 Vicryl suture from 3 to 9:00 McCall's culdoplasty suture was then placed approximating left uterosacral ligament aching up posterior peritoneum across to the right uterosacral ligament which is in then tied down for extra posterior support prep closure sponge denies precast reported as correct x2. The vaginal mucosa was then closed right to left with interrupted 2-0 Monocryl sutures. This was hemostatic catheter positioned draining clear urine. Repeat laparoscopy was then carried out at that point, the Nezhat irrigator was then used to irrigate the field and aspirated, pressure was reduced and the operative sites were noted be hemostatic. Prior to closure sponge denies precast reported as correct x2. Gas was allowed to escape the upper incision closed with a 4-0 Monocryl subcuticular, Dermabond on the suprapubic incision. She tolerated this well went to recovery room in good condition.  Dictated with [facility]  Kenadie Royce M. Garry Heater.D.

## 2013-12-12 NOTE — Anesthesia Postprocedure Evaluation (Signed)
  Anesthesia Post-op Note  Anesthesia Post Note  Patient: [name]  Procedure(s) Performed: Procedure(s) (LRB): LAPAROSCOPIC ASSISTED VAGINAL HYSTERECTOMY (N/A) BILATERAL SALPINGECTOMY (Bilateral)  Anesthesia type: General  Patient location: PACU  Post pain: Pain level controlled  Post assessment: Post-op Vital signs reviewed  Last Vitals:  Filed Vitals:   12/12/13 1015  BP:   Pulse:   Temp: 36.3 C  Resp: 18    Post vital signs: Reviewed  Level of consciousness: sedated  Complications: No apparent anesthesia complications

## 2013-12-12 NOTE — Addendum Note (Signed)
Addendum created 12/12/13 1429 by Georgeanne Nim, CRNA   Modules edited: Charges VN, Notes Section   Notes Section:  File: [id number]

## 2013-12-12 NOTE — Progress Notes (Signed)
Ur chart review completed.  

## 2013-12-12 NOTE — Anesthesia Preprocedure Evaluation (Signed)
Anesthesia Evaluation  Patient identified by MRN, date of birth, ID band Patient awake    Reviewed: Allergy & Precautions, H&P , NPO status , Patient's Chart, lab work & pertinent test results  Airway Mallampati: I TM Distance: >3 FB Neck ROM: full    Dental no notable dental hx. (+) Teeth Intact   Pulmonary former smoker,    Pulmonary exam normal       Cardiovascular     Neuro/Psych negative psych ROS   GI/Hepatic negative GI ROS, Neg liver ROS,   Endo/Other  negative endocrine ROS  Renal/GU negative Renal ROS     Musculoskeletal   Abdominal Normal abdominal exam  (+)   Peds  Hematology negative hematology ROS (+)   Anesthesia Other Findings   Reproductive/Obstetrics negative OB ROS                           Anesthesia Physical Anesthesia Plan  ASA: II  Anesthesia Plan: General   Post-op Pain Management:    Induction: Intravenous  Airway Management Planned: Oral ETT  Additional Equipment:   Intra-op Plan:   Post-operative Plan: Extubation in OR  Informed Consent: I have reviewed the patients History and Physical, chart, labs and discussed the procedure including the risks, benefits and alternatives for the proposed anesthesia with the patient or authorized representative who has indicated his/her understanding and acceptance.   Dental Advisory Given  Plan Discussed with: CRNA, Surgeon and Anesthesiologist  Anesthesia Plan Comments:         Anesthesia Quick Evaluation

## 2013-12-12 NOTE — Transfer of Care (Signed)
Immediate Anesthesia Transfer of Care Note  Patient: [name]  Procedure(s) Performed: Procedure(s): LAPAROSCOPIC ASSISTED VAGINAL HYSTERECTOMY (N/A) BILATERAL SALPINGECTOMY (Bilateral)  Patient Location: PACU  Anesthesia Type:General  Level of Consciousness: awake, alert  and oriented  Airway & Oxygen Therapy: Patient Spontanous Breathing and Patient connected to nasal cannula oxygen  Post-op Assessment: Report given to PACU RN and Post -op Vital signs reviewed and stable  Post vital signs: Reviewed and stable  Complications: No apparent anesthesia complications

## 2013-12-13 ENCOUNTER — Encounter (HOSPITAL_COMMUNITY): Payer: Self-pay | Admitting: Obstetrics and Gynecology

## 2013-12-13 DIAGNOSIS — N920 Excessive and frequent menstruation with regular cycle: Secondary | ICD-10-CM | POA: Diagnosis not present

## 2013-12-13 LAB — CBC
HCT: 28.7 % — ABNORMAL LOW (ref 36.0–46.0)
Hemoglobin: 9.6 g/dL — ABNORMAL LOW (ref 12.0–15.0)
MCH: 29.8 pg (ref 26.0–34.0)
MCHC: 33.4 g/dL (ref 30.0–36.0)
MCV: 89.1 fL (ref 78.0–100.0)
PLATELETS: 215 10*3/uL (ref 150–400)
RBC: 3.22 MIL/uL — ABNORMAL LOW (ref 3.87–5.11)
RDW: 13.1 % (ref 11.5–15.5)
WBC: 13.4 10*3/uL — AB (ref 4.0–10.5)

## 2013-12-13 MED ORDER — IBUPROFEN 800 MG PO TABS: 800.0000 mg | ORAL_TABLET | Freq: Three times a day (TID) | ORAL | Status: DC | PRN

## 2013-12-13 MED ORDER — OXYCODONE-ACETAMINOPHEN 5-325 MG PO TABS: 1.0000 | ORAL_TABLET | ORAL | Status: DC | PRN

## 2013-12-13 NOTE — Discharge Summary (Signed)
Physician Discharge Summary  Patient ID: [name] MRN: [id number] DOB/AGE: 1981/02/14 33 y.o.  Admit date: 12/12/2013 Discharge date: 12/13/2013  Admission Diagnoses:dysmenorrhea/pelvic pain  Discharge Diagnoses: same Active Problems:   Dysmenorrhea   Discharged Condition: good  Hospital Course: adm for LAVH + bilat salpingectomy for dysmenorrhea. D/C on POD 1, afeb, tol PO  Consults: None  Significant Diagnostic Studies: labs:  CBC    Component Value Date/Time   WBC 13.4* 12/13/2013 0520   RBC 3.22* 12/13/2013 0520   HGB 9.6* 12/13/2013 0520   HCT 28.7* 12/13/2013 0520   PLT 215 12/13/2013 0520   MCV 89.1 12/13/2013 0520   MCH 29.8 12/13/2013 0520   MCHC 33.4 12/13/2013 0520   RDW 13.1 12/13/2013 0520    .  Treatments: surgery: LAVH/bilat salpingectomy  Discharge Exam: Blood pressure 95/52, pulse 55, temperature 98 F (36.7 C), temperature source Oral, resp. rate 16, height 5\' 4"  (1.626 m), weight 169 lb (76.658 kg), SpO2 98.00%. General appearance: alert abd soft + BS, nontender Disposition: home     Medication List         ibuprofen 800 MG tablet  Commonly known as:  ADVIL,MOTRIN  Take 1 tablet (800 mg total) by mouth every 8 (eight) hours as needed for moderate pain (mild pain).     lamoTRIgine 100 MG tablet  Commonly known as:  LAMICTAL  Take 100 mg by mouth daily.     metoprolol tartrate 25 MG tablet  Commonly known as:  LOPRESSOR  Take 25 mg by mouth daily.     tiZANidine 4 MG capsule  Commonly known as:  ZANAFLEX  Take 4 mg by mouth 3 (three) times daily as needed for muscle spasms.     zolpidem 10 MG tablet  Commonly known as:  AMBIEN  Take 10 mg by mouth at bedtime as needed for sleep.           Follow-up Information   Follow up with Margarette Asal, MD. Schedule an appointment as soon as possible for a visit in 10 days.   Specialty:  Obstetrics and Gynecology   Contact information:   [address]  [place] [address] [phone]       Signed: Margarette Asal 12/13/2013, 8:20 AM

## 2015-06-13 ENCOUNTER — Other Ambulatory Visit (HOSPITAL_COMMUNITY): Payer: Self-pay | Admitting: Family Medicine

## 2015-06-13 DIAGNOSIS — Z1231 Encounter for screening mammogram for malignant neoplasm of breast: Secondary | ICD-10-CM

## 2015-06-25 ENCOUNTER — Ambulatory Visit (HOSPITAL_COMMUNITY)
Admission: RE | Admit: 2015-06-25 | Discharge: 2015-06-25 | Disposition: A | Discharge status: Home / Self Care | Payer: PRIVATE HEALTH INSURANCE | Source: Ambulatory Visit | Attending: Family Medicine | Admitting: Family Medicine

## 2015-06-25 DIAGNOSIS — Z1231 Encounter for screening mammogram for malignant neoplasm of breast: Secondary | ICD-10-CM | POA: Insufficient documentation

## 2015-07-02 ENCOUNTER — Other Ambulatory Visit: Payer: Self-pay | Admitting: Family Medicine

## 2015-07-02 DIAGNOSIS — R928 Other abnormal and inconclusive findings on diagnostic imaging of breast: Secondary | ICD-10-CM

## 2015-07-06 ENCOUNTER — Ambulatory Visit
Admission: RE | Admit: 2015-07-06 | Discharge: 2015-07-06 | Disposition: A | Discharge status: Home / Self Care | Payer: PRIVATE HEALTH INSURANCE | Source: Ambulatory Visit | Attending: Family Medicine | Admitting: Family Medicine

## 2015-07-06 DIAGNOSIS — R928 Other abnormal and inconclusive findings on diagnostic imaging of breast: Secondary | ICD-10-CM

## 2015-08-02 ENCOUNTER — Other Ambulatory Visit (HOSPITAL_COMMUNITY): Payer: Self-pay | Admitting: Internal Medicine

## 2015-08-02 DIAGNOSIS — R109 Unspecified abdominal pain: Secondary | ICD-10-CM

## 2015-08-07 ENCOUNTER — Encounter (HOSPITAL_COMMUNITY): Payer: Self-pay

## 2015-08-07 ENCOUNTER — Encounter (HOSPITAL_COMMUNITY)
Admission: RE | Admit: 2015-08-07 | Discharge: 2015-08-07 | Disposition: A | Discharge status: Home / Self Care | Payer: PRIVATE HEALTH INSURANCE | Source: Ambulatory Visit | Attending: Internal Medicine | Admitting: Internal Medicine

## 2015-08-07 DIAGNOSIS — R109 Unspecified abdominal pain: Secondary | ICD-10-CM | POA: Diagnosis not present

## 2015-08-07 MED ORDER — TECHNETIUM TC 99M MEBROFENIN IV KIT
5.0000 | PACK | Freq: Once | INTRAVENOUS | Status: AC | PRN
  Administered 2015-08-07: 5 via INTRAVENOUS

## 2015-09-05 ENCOUNTER — Encounter: Payer: Self-pay | Admitting: Gastroenterology

## 2015-09-26 ENCOUNTER — Ambulatory Visit: Payer: PRIVATE HEALTH INSURANCE | Admitting: Gastroenterology

## 2015-09-28 ENCOUNTER — Ambulatory Visit (INDEPENDENT_AMBULATORY_CARE_PROVIDER_SITE_OTHER): Payer: PRIVATE HEALTH INSURANCE | Admitting: Gastroenterology

## 2015-09-28 ENCOUNTER — Encounter (INDEPENDENT_AMBULATORY_CARE_PROVIDER_SITE_OTHER): Payer: Self-pay

## 2015-09-28 ENCOUNTER — Encounter: Payer: Self-pay | Admitting: Gastroenterology

## 2015-09-28 ENCOUNTER — Other Ambulatory Visit: Payer: Self-pay

## 2015-09-28 VITALS — BP 129/90 | HR 80 | Temp 97.8°F | Ht 63.0 in | Wt 182.6 lb

## 2015-09-28 DIAGNOSIS — R1011 Right upper quadrant pain: Secondary | ICD-10-CM | POA: Diagnosis not present

## 2015-09-28 DIAGNOSIS — K219 Gastro-esophageal reflux disease without esophagitis: Secondary | ICD-10-CM | POA: Insufficient documentation

## 2015-09-28 DIAGNOSIS — K760 Fatty (change of) liver, not elsewhere classified: Secondary | ICD-10-CM

## 2015-09-28 MED ORDER — PANTOPRAZOLE SODIUM 40 MG PO TBEC: 40.0000 mg | DELAYED_RELEASE_TABLET | Freq: Every day | ORAL | Status: DC

## 2015-09-28 NOTE — Progress Notes (Signed)
Primary Care Physician:  Glo Herring., MD Primary Gastroenterologist:  Dr. Oneida Alar   Chief Complaint  Patient presents with  . Gastroesophageal Reflux    HPI:   [name] is a 35 y.o. female presenting today at the request of her PCP secondary to GERD and abdominal pain.   Underwent HIDA April 2017. No pain with HIDA. March 2017 hepatic steatosis on outside ultrasound. Pain for 6-7 months in RUQ, radiates around side. Pain sometimes worsened after eating. Works night shift. Eats a lot of Tums. No PPI. Takes Ibuprofen about once a day, 800 mg. No aspirin powders. No dysphagia. Significant reflux symptoms. Has had such bad symptoms she started vomiting. Drinking pepto to soothe throat. Knows when the acid will come up because abdominal pain starts. Has woken up at night with stabbing pains but was quick and short-lived. Weight gain noted, no weight loss. No overt GI bleeding. No prior EGD or colonoscopy. No lower GI symptoms.   Past Medical History  Diagnosis Date  . Dysrhythmia     followed by PCP in [place]  . Seizures ([place])     no seizure episodes in "years"  . Insomnia   . HTN (hypertension)   . Hypothyroidism     Past Surgical History  Procedure Laterality Date  . [place]    . Laparoscopic assisted vaginal hysterectomy N/A 12/12/2013    uterine fibroids  . Bilateral salpingectomy Bilateral 12/12/2013    Procedure: BILATERAL SALPINGECTOMY;  Surgeon: Margarette Asal, MD;  Location: [facility];  Service: Gynecology;  Laterality: Bilateral;    Current Outpatient Prescriptions  Medication Sig Dispense Refill  . ibuprofen (ADVIL,MOTRIN) 800 MG tablet Take 1 tablet (800 mg total) by mouth every 8 (eight) hours as needed for moderate pain (mild pain). 30 tablet 1  . lamoTRIgine (LAMICTAL) 100 MG tablet Take 100 mg by mouth daily.    Marland Kitchen levothyroxine (SYNTHROID, LEVOTHROID) 25 MCG tablet Take by mouth daily before breakfast.   0  . metoprolol tartrate (LOPRESSOR) 25  MG tablet Take 25 mg by mouth daily.    Marland Kitchen tiZANidine (ZANAFLEX) 4 MG capsule Take 4 mg by mouth 3 (three) times daily as needed for muscle spasms.    Marland Kitchen zolpidem (AMBIEN) 10 MG tablet Take 10 mg by mouth at bedtime as needed for sleep.    . pantoprazole (PROTONIX) 40 MG tablet Take 1 tablet (40 mg total) by mouth daily. 30 minutes before the first meal of the day 30 tablet 3   No current facility-administered medications for this visit.    Allergies as of 09/28/2015 - Review Complete 09/28/2015  Allergen Reaction Noted  . Albuterol Hives and Shortness Of Breath 12/09/2013    Family History  Problem Relation Age of Onset  . Colon cancer Neg Hx     Social History   Social History  . Marital Status: Married    Spouse Name: N/A  . Number of Children: N/A  . Years of Education: N/A   Occupational History  . RN     [employer]    Social History Main Topics  . Smoking status: Former Research scientist (life sciences)  . Smokeless tobacco: Not on file  . Alcohol Use: No  . Drug Use: No  . Sexual Activity: Not on file   Other Topics Concern  . Not on file   Social History Narrative    Review of Systems: As mentioned in HPI.   Physical Exam: BP 129/90 mmHg  Pulse 80  Temp(Src) 97.8 F (36.6 C) (Oral)  Ht 5\' 3"  (1.6 m)  Wt 182 lb 9.6 oz (82.827 kg)  BMI 32.35 kg/m2  LMP 05/05/2013 General:   Alert and oriented. Pleasant and cooperative. Well-nourished and well-developed.  Head:  Normocephalic and atraumatic. Eyes:  Without icterus, sclera clear and conjunctiva pink.  Ears:  Normal auditory acuity. Nose:  No deformity, discharge,  or lesions. Mouth:  No deformity or lesions, oral mucosa pink.  Lungs:  Clear to auscultation bilaterally. No wheezes, rales, or rhonchi. No distress.  Heart:  S1, S2 present without murmurs appreciated.  Abdomen:  +BS, soft, non-tender and non-distended. No HSM noted. No guarding or rebound. No masses appreciated.  Rectal:  Deferred  Msk:  Symmetrical without  gross deformities. Normal posture Extremities:  Without clubbing or edema. Neurologic:  Alert and  oriented x4;  grossly normal neurologically. Skin:  Intact without significant lesions or rashes Psych:  Alert and cooperative. Normal mood and affect.

## 2015-09-28 NOTE — Patient Instructions (Addendum)
Start taking Protonix once each morning, 30 minutes before breakfast.   We have scheduled you for an upper endoscopy with Dr. Oneida Alar in the near future.  Stop Ibuprofen. You may use Tylenol  I have attached a reflux sheet for you and information on fatty liver.   Food Choices for Gastroesophageal Reflux Disease, Adult When you have gastroesophageal reflux disease (GERD), the foods you eat and your eating habits are very important. Choosing the right foods can help ease the discomfort of GERD. WHAT GENERAL GUIDELINES DO I NEED TO FOLLOW?  Choose fruits, vegetables, whole grains, low-fat dairy products, and low-fat meat, fish, and poultry.  Limit fats such as oils, salad dressings, butter, nuts, and avocado.  Keep a food diary to identify foods that cause symptoms.  Avoid foods that cause reflux. These may be different for different people.  Eat frequent small meals instead of three large meals each day.  Eat your meals slowly, in a relaxed setting.  Limit fried foods.  Cook foods using methods other than frying.  Avoid drinking alcohol.  Avoid drinking large amounts of liquids with your meals.  Avoid bending over or lying down until 2-3 hours after eating. WHAT FOODS ARE NOT RECOMMENDED? The following are some foods and drinks that may worsen your symptoms: Vegetables Tomatoes. Tomato juice. Tomato and spaghetti sauce. Chili peppers. Onion and garlic. Horseradish. Fruits Oranges, grapefruit, and lemon (fruit and juice). Meats High-fat meats, fish, and poultry. This includes hot dogs, ribs, ham, sausage, salami, and bacon. Dairy Whole milk and chocolate milk. Sour cream. Cream. Butter. Ice cream. Cream cheese.  Beverages Coffee and tea, with or without caffeine. Carbonated beverages or energy drinks. Condiments Hot sauce. Barbecue sauce.  Sweets/Desserts Chocolate and cocoa. Donuts. Peppermint and spearmint. Fats and Oils High-fat foods, including [place] fries and  potato chips. Other Vinegar. Strong spices, such as black pepper, white pepper, red pepper, cayenne, curry powder, cloves, ginger, and chili powder. The items listed above may not be a complete list of foods and beverages to avoid. Contact your dietitian for more information.   This information is not intended to replace advice given to you by your health care provider. Make sure you discuss any questions you have with your health care provider.   Document Released: 04/07/2005 Document Revised: 04/28/2014 Document Reviewed: 02/09/2013 Elsevier Interactive Patient Education 2016 Elsevier Inc. Fatty Liver Fatty liver, also called hepatic steatosis or steatohepatitis, is a condition in which too much fat has built up in your liver cells. The liver removes harmful substances from your bloodstream. It produces fluids your body needs. It also helps your body use and store energy from the food you eat. In many cases, fatty liver does not cause symptoms or problems. It is often diagnosed when tests are being done for other reasons. However, over time, fatty liver can cause inflammation that may lead to more serious liver problems, such as scarring of the liver (cirrhosis). CAUSES  Causes of fatty liver may include:   Drinking too much alcohol.  Poor nutrition.  Obesity.  Cushing syndrome.  Diabetes.  Hyperlipidemia.  Pregnancy.  Certain drugs.  Poisons.  Some viral infections. RISK FACTORS You may be more likely to develop fatty liver if you:  Abuse alcohol.  Are pregnant.  Are overweight.  Have diabetes.  Have hepatitis.  Have a high triglyceride level.  SIGNS AND SYMPTOMS  Fatty liver often does not cause any symptoms. In cases where symptoms develop, they can include:  Fatigue.  Weakness.  Weight loss.  Confusion.   Abdominal pain.  Yellowing of your skin and the white parts of your eyes (jaundice).  Nausea and vomiting. DIAGNOSIS  Fatty liver may be  diagnosed by:   Physical exam and medical history.  Blood tests.  Imaging tests, such as an ultrasound, CT scan, or MRI.  Liver biopsy. A small sample of liver tissue is removed using a needle. The sample is then looked at under a microscope. TREATMENT  Fatty liver is often caused by other health conditions. Treatment for fatty liver may involve medicines and lifestyle changes to manage conditions such as:   Alcoholism.  High cholesterol.  Diabetes.  Being overweight or obese.  HOME CARE INSTRUCTIONS  Eat a healthy diet as directed by your health care provider.  Exercise regularly. This can help you lose weight and control your cholesterol and diabetes. Talk to your health care provider about an exercise plan and which activities are best for you.  Do not drink alcohol.   Take medicines only as directed by your health care provider. SEEK MEDICAL CARE IF: You have difficulty controlling your:  Blood sugar.  Cholesterol.  Alcohol consumption. SEEK IMMEDIATE MEDICAL CARE IF:  You have abdominal pain.  You have jaundice.  You have nausea and vomiting.   This information is not intended to replace advice given to you by your health care provider. Make sure you discuss any questions you have with your health care provider.   Document Released: 05/23/2005 Document Revised: 04/28/2014 Document Reviewed: 08/17/2013 Elsevier Interactive Patient Education [employer].

## 2015-09-30 NOTE — Assessment & Plan Note (Signed)
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 Start Protonix once daily. See RUQ. Diet sheet provided.

## 2015-09-30 NOTE — Assessment & Plan Note (Signed)
Intermittent RUQ pain, severe GERD symptoms, in presence of NSAIDs routinely. Start Protonix once daily. Avoid NSAIDs. Gallbladder remains in situ but US abdomen with fatty liver and HIDA normal. Low likelihood of biliary etiology. Query gastritis, PUD. Will pursue EGD in near future.   Proceed with upper endoscopy in the near future with Dr. Oneida Alar. The risks, benefits, and alternatives have been discussed in detail with patient. They have stated understanding and desire to proceed.  Phenergan 25 mg IV on call

## 2015-09-30 NOTE — Assessment & Plan Note (Signed)
Discussed dietary and behavior modification. Fatty liver handout provided. Will retrieve outside labs from PCP.

## 2015-10-01 ENCOUNTER — Encounter (HOSPITAL_COMMUNITY): Payer: Self-pay | Admitting: *Deleted

## 2015-10-01 ENCOUNTER — Encounter (HOSPITAL_COMMUNITY)
Admission: RE | Disposition: A | Discharge status: Home / Self Care | Payer: Self-pay | Source: Ambulatory Visit | Attending: Gastroenterology

## 2015-10-01 ENCOUNTER — Ambulatory Visit (HOSPITAL_COMMUNITY)
Admission: RE | Admit: 2015-10-01 | Discharge: 2015-10-01 | Disposition: A | Discharge status: Home / Self Care | Payer: PRIVATE HEALTH INSURANCE | Source: Ambulatory Visit | Attending: Gastroenterology | Admitting: Gastroenterology

## 2015-10-01 DIAGNOSIS — K2970 Gastritis, unspecified, without bleeding: Secondary | ICD-10-CM | POA: Diagnosis not present

## 2015-10-01 DIAGNOSIS — I10 Essential (primary) hypertension: Secondary | ICD-10-CM | POA: Diagnosis not present

## 2015-10-01 DIAGNOSIS — Z79899 Other long term (current) drug therapy: Secondary | ICD-10-CM | POA: Insufficient documentation

## 2015-10-01 DIAGNOSIS — R1011 Right upper quadrant pain: Secondary | ICD-10-CM

## 2015-10-01 DIAGNOSIS — K5710 Diverticulosis of small intestine without perforation or abscess without bleeding: Secondary | ICD-10-CM | POA: Diagnosis not present

## 2015-10-01 DIAGNOSIS — K219 Gastro-esophageal reflux disease without esophagitis: Secondary | ICD-10-CM | POA: Diagnosis not present

## 2015-10-01 DIAGNOSIS — E039 Hypothyroidism, unspecified: Secondary | ICD-10-CM | POA: Diagnosis not present

## 2015-10-01 DIAGNOSIS — R1013 Epigastric pain: Secondary | ICD-10-CM | POA: Diagnosis present

## 2015-10-01 DIAGNOSIS — K319 Disease of stomach and duodenum, unspecified: Secondary | ICD-10-CM | POA: Insufficient documentation

## 2015-10-01 HISTORY — PX: ESOPHAGOGASTRODUODENOSCOPY: SHX5428

## 2015-10-01 SURGERY — EGD (ESOPHAGOGASTRODUODENOSCOPY)
Anesthesia: Moderate Sedation

## 2015-10-01 MED ORDER — SODIUM CHLORIDE 0.9 % IV SOLN
INTRAVENOUS | Status: DC
  Administered 2015-10-01: 1000 mL via INTRAVENOUS

## 2015-10-01 MED ORDER — LIDOCAINE VISCOUS 2 % MT SOLN
OROMUCOSAL | Status: DC | PRN
  Administered 2015-10-01: 4 mL via OROMUCOSAL

## 2015-10-01 MED ORDER — PROMETHAZINE HCL 25 MG/ML IJ SOLN
INTRAMUSCULAR | Status: AC
  Filled 2015-10-01: qty 1

## 2015-10-01 MED ORDER — LIDOCAINE VISCOUS 2 % MT SOLN
OROMUCOSAL | Status: AC
  Filled 2015-10-01: qty 15

## 2015-10-01 MED ORDER — MEPERIDINE HCL 100 MG/ML IJ SOLN
INTRAMUSCULAR | Status: AC
  Filled 2015-10-01: qty 2

## 2015-10-01 MED ORDER — SODIUM CHLORIDE 0.9% FLUSH
INTRAVENOUS | Status: AC
  Filled 2015-10-01: qty 10

## 2015-10-01 MED ORDER — STERILE WATER FOR IRRIGATION IR SOLN
Status: DC | PRN
  Administered 2015-10-01: 16:00:00

## 2015-10-01 MED ORDER — MIDAZOLAM HCL 5 MG/5ML IJ SOLN
INTRAMUSCULAR | Status: AC
  Filled 2015-10-01: qty 10

## 2015-10-01 MED ORDER — MEPERIDINE HCL 100 MG/ML IJ SOLN
INTRAMUSCULAR | Status: DC | PRN
  Administered 2015-10-01 (×4): 25 mg via INTRAVENOUS

## 2015-10-01 MED ORDER — PROMETHAZINE HCL 25 MG/ML IJ SOLN
25.0000 mg | Freq: Once | INTRAMUSCULAR | Status: AC
  Administered 2015-10-01: 25 mg via INTRAVENOUS

## 2015-10-01 MED ORDER — MIDAZOLAM HCL 5 MG/5ML IJ SOLN
INTRAMUSCULAR | Status: DC | PRN
Start: 2015-10-01 — End: 2015-10-01
  Administered 2015-10-01 (×2): 2 mg via INTRAVENOUS
  Administered 2015-10-01: 1 mg via INTRAVENOUS
  Administered 2015-10-01: 2 mg via INTRAVENOUS

## 2015-10-01 NOTE — Op Note (Signed)
[facility] Patient Name: [name] Procedure Date: 10/01/2015 12:28 PM MRN: [id number] Date of Birth: April 12, 1981 Attending MD: Barney Drain , MD CSN: [id number] Age: 35 Admit Type: Outpatient Procedure:                Upper GI endoscopy with COLD FORCEPS BIOPSY OF                            GASTRIC/DUODENAL MUCOSA Indications:              Epigastric abdominal pain, Abdominal pain in the                            right upper quadrant, Dyspepsia Providers:                Barney Drain, MD, Rosina Lowenstein, RN, Georgeann Oppenheim,                            Technician Referring MD:             [employer], MD Medicines:                Promethazine 25 mg IV, Meperidine 100 mg IV,                            Midazolam 7 mg IV Complications:            No immediate complications. Estimated Blood Loss:     Estimated blood loss was minimal. Procedure:                Pre-Anesthesia Assessment:                           - Prior to the procedure, a History and Physical                            was performed, and patient medications and                            allergies were reviewed. The patient's tolerance of                            previous anesthesia was also reviewed. The risks                            and benefits of the procedure and the sedation                            options and risks were discussed with the patient.                            All questions were answered, and informed consent                            was obtained. Prior Anticoagulants: The patient has  taken ibuprofen, last dose was day of procedure.                            ASA Grade Assessment: II - A patient with mild                            systemic disease. After reviewing the risks and                            benefits, the patient was deemed in satisfactory                            condition to undergo the procedure.                           After  obtaining informed consent, the endoscope was                            passed under direct vision. Throughout the                            procedure, the patient's blood pressure, pulse, and                            oxygen saturations were monitored continuously. The                            [id number]) scope was introduced through the                            mouth, and advanced to the second part of duodenum.                            The upper GI endoscopy was accomplished without                            difficulty. The patient tolerated the procedure                            fairly well. Scope In: 4:02:49 PM Scope Out: 4:10:23 PM Total Procedure Duration: 0 hours 7 minutes 34 seconds  Findings:      The examined esophagus was normal.      Scattered mild inflammation characterized by congestion (edema) and       erythema was found in the gastric antrum. Biopsies were taken with a       cold forceps for Helicobacter pylori testing.      A small diverticulum was found in the second portion of the duodenum.      The ampulla and second portion of the duodenum were normal. Biopsies for       histology were taken with a cold forceps for evaluation of celiac       disease. Impression:               - Normal esophagus.                           -  Gastritis, MILD                           - MILD Duodenal diverticulOSIS.                           - RUQ/EPIGASTRIC PAIN DUE TO GERD/GASTRITIS Moderate Sedation:      Moderate (conscious) sedation was administered by the endoscopy nurse       and supervised by the endoscopist. The following parameters were       monitored: oxygen saturation, heart rate, blood pressure, and response       to care. Total physician intraservice time was 23 minutes. Recommendation:           - Low fat diet.                           - Continue present medications.                           - Await pathology results.                           -  Return to my office in 4 months.                           - Patient has a contact number available for                            emergencies. The signs and symptoms of potential                            delayed complications were discussed with the                            patient. Return to normal activities tomorrow.                            Written discharge instructions were provided to the                            patient. Procedure Code(s):        --- Professional ---                           [phone], Esophagogastroduodenoscopy, flexible,                            transoral; with biopsy, single or multiple                           99153, Moderate sedation services; each additional                            15 minutes intraservice time                           G0500, Moderate sedation services provided by  the                            same physician or other qualified health care                            professional performing a gastrointestinal                            endoscopic service that sedation supports,                            requiring the presence of an independent trained                            observer to assist in the monitoring of the                            patient's level of consciousness and physiological                            status; initial 15 minutes of intra-service time;                            patient age 52 years or older (additional time may                            be reported with [phone], as appropriate) Diagnosis Code(s):        --- Professional ---                           K29.70, Gastritis, unspecified, without bleeding                           R10.13, Epigastric pain                           R10.11, Right upper quadrant pain                           K57.10, Diverticulosis of small intestine without                            perforation or abscess without bleeding CPT copyright 2016 American Medical Association.  All rights reserved. The codes documented in this report are preliminary and upon coder review may  be revised to meet current compliance requirements. Barney Drain, MD Barney Drain, MD 10/01/2015 4:22:36 PM This report has been signed electronically. Number of Addenda: 0

## 2015-10-01 NOTE — H&P (Signed)
  Primary Care Physician:  Glo Herring., MD Primary Gastroenterologist:  Dr. Oneida Alar  Pre-Procedure History & Physical: HPI:  [name] is a 35 y.o. female here for DYSPEPSIA.  Past Medical History  Diagnosis Date  . Dysrhythmia     followed by PCP in [place]  . Seizures ([place])     no seizure episodes in "years"  . Insomnia   . HTN (hypertension)   . Hypothyroidism     Past Surgical History  Procedure Laterality Date  . [place]    . Laparoscopic assisted vaginal hysterectomy N/A 12/12/2013    uterine fibroids  . Bilateral salpingectomy Bilateral 12/12/2013    Procedure: BILATERAL SALPINGECTOMY;  Surgeon: Margarette Asal, MD;  Location: [facility];  Service: Gynecology;  Laterality: Bilateral;    Prior to Admission medications   Medication Sig Start Date End Date Taking? Authorizing Provider  lamoTRIgine (LAMICTAL) 100 MG tablet Take 100 mg by mouth daily.   Yes Historical Provider, MD  levothyroxine (SYNTHROID, LEVOTHROID) 25 MCG tablet Take by mouth daily before breakfast.  07/23/15  Yes Historical Provider, MD  metoprolol tartrate (LOPRESSOR) 25 MG tablet Take 25 mg by mouth daily.   Yes Historical Provider, MD  tiZANidine (ZANAFLEX) 4 MG capsule Take 4 mg by mouth 3 (three) times daily as needed for muscle spasms.   Yes Historical Provider, MD  zolpidem (AMBIEN) 10 MG tablet Take 10 mg by mouth at bedtime as needed for sleep.   Yes Historical Provider, MD  ibuprofen (ADVIL,MOTRIN) 800 MG tablet Take 1 tablet (800 mg total) by mouth every 8 (eight) hours as needed for moderate pain (mild pain). 12/13/13   Molli Posey, MD  pantoprazole (PROTONIX) 40 MG tablet Take 1 tablet (40 mg total) by mouth daily. 30 minutes before the first meal of the day 09/28/15   Orvil Feil, NP    Allergies as of 09/28/2015 - Review Complete 09/28/2015  Allergen Reaction Noted  . Albuterol Hives and Shortness Of Breath 12/09/2013    Family History  Problem Relation Age of Onset  . Colon  cancer Neg Hx   . Hypertension Father   . Diabetes Father     Social History   Social History  . Marital Status: Married    Spouse Name: N/A  . Number of Children: N/A  . Years of Education: N/A   Occupational History  . RN     [employer]    Social History Main Topics  . Smoking status: Former Research scientist (life sciences)  . Smokeless tobacco: Not on file  . Alcohol Use: No  . Drug Use: No  . Sexual Activity: Not on file   Other Topics Concern  . Not on file   Social History Narrative    Review of Systems: See HPI, otherwise negative ROS   Physical Exam: BP 122/75 mmHg  Pulse 72  Temp(Src) 98.9 F (37.2 C) (Oral)  Resp 12  Ht 5\' 4"  (1.626 m)  Wt 182 lb 3.2 oz (82.645 kg)  BMI 31.26 kg/m2  SpO2 100%  LMP 05/05/2013 General:   Alert,  pleasant and cooperative in NAD Head:  Normocephalic and atraumatic. Neck:  Supple; Lungs:  Clear throughout to auscultation.    Heart:  Regular rate and rhythm. Abdomen:  Soft, nontender and nondistended. Normal bowel sounds, without guarding, and without rebound.   Neurologic:  Alert and  oriented x4;  grossly normal neurologically.  Impression/Plan:     DYSPEPSIA  PLAN:  EGD TODAY

## 2015-10-01 NOTE — Progress Notes (Signed)
cc'ed to pcp °

## 2015-10-01 NOTE — Discharge Instructions (Signed)
YOUR UPPER ABDOMINAL PAIN IS MOST LIKELY DUE TO REFLUX AND GASTRITIS. You have mild gastritis and duodenal diverticulosis. I biopsied your stomach and small bowel.   DRINK WATER TO KEEP YOUR URINE LIGHT YELLOW.  CONTINUE YOUR WEIGHT LOSS EFFORTS. LOSE TEN POUNDS.  FOLLOW A LOW FAT DIET. MEATS SHOULD BE BAKED, BROILED, OR BOILED. AVOID FRIED FOODS. SEE INFO BELOW.  AVOID TRIGGERS FOR REFLUX and MINIMIZE EXPOSURE TO THINGS THAT CAUSE gastritis. SEE INFO BELOW.  FOLLOW UP IN 4 MOS.  UPPER ENDOSCOPY AFTER CARE Read the instructions outlined below and refer to this sheet in the next week. These discharge instructions provide you with general information on caring for yourself after you leave the hospital. While your treatment has been planned according to the most current medical practices available, unavoidable complications occasionally occur. If you have any problems or questions after discharge, call DR. Shallen Luedke, [phone].  ACTIVITY  You may resume your regular activity, but move at a slower pace for the next 24 hours.   Take frequent rest periods for the next 24 hours.   Walking will help get rid of the air and reduce the bloated feeling in your belly (abdomen).   No driving for 24 hours (because of the medicine (anesthesia) used during the test).   You may shower.   Do not sign any important legal documents or operate any machinery for 24 hours (because of the anesthesia used during the test).    NUTRITION  Drink plenty of fluids.   You may resume your normal diet as instructed by your doctor.   Begin with a light meal and progress to your normal diet. Heavy or fried foods are harder to digest and may make you feel sick to your stomach (nauseated).   Avoid alcoholic beverages for 24 hours or as instructed.    MEDICATIONS  You may resume your normal medications.   WHAT YOU CAN EXPECT TODAY  Some feelings of bloating in the abdomen.   Passage of more gas than  usual.    IF YOU HAD A BIOPSY TAKEN DURING THE UPPER ENDOSCOPY:  Eat a soft diet IF YOU HAVE NAUSEA, BLOATING, ABDOMINAL PAIN, OR VOMITING.    FINDING OUT THE RESULTS OF YOUR TEST Not all test results are available during your visit. DR. Oneida Alar WILL CALL YOU WITHIN 14 DAYS OF YOUR PROCEDUE WITH YOUR RESULTS. Do not assume everything is normal if you have not heard from DR. Abree Romick, CALL HER OFFICE AT [phone].  SEEK IMMEDIATE MEDICAL ATTENTION AND CALL THE OFFICE: [phone] IF:  You have more than a spotting of blood in your stool.   Your belly is swollen (abdominal distention).   You are nauseated or vomiting.   You have a temperature over 101F.   You have abdominal pain or discomfort that is severe or gets worse throughout the day.    Lifestyle and home remedies TO CONTROL REFLUX You may eliminate or reduce the frequency of heartburn by making the following lifestyle changes:   Control your weight. Being overweight is a major risk factor for heartburn and GERD. Excess pounds put pressure on your abdomen, pushing up your stomach and causing acid to back up into your esophagus.    Eat smaller meals. 4 TO 6 MEALS A DAY. This reduces pressure on the lower esophageal sphincter, helping to prevent the valve from opening and acid from washing back into your esophagus.    Loosen your belt. Clothes that fit tightly around your waist put pressure  on your abdomen and the lower esophageal sphincter.     Eliminate heartburn triggers. Everyone has specific triggers.Common triggers such as fatty or fried foods, spicy food, tomato sauce, carbonated beverages, alcohol, chocolate, mint, garlic, onion, caffeine and nicotine may make heartburn worse.    Avoid stooping or bending. Tying your shoes is OK. Bending over for longer periods to weed your garden isn't, especially soon after eating.    Don't lie down OR BEND OVER after a meal. Wait at least three to four hours after eating  before going to bed, and don't lie down right after eating.   Alternative medicine  Several home remedies exist for treating GERD, but they provide only temporary relief. They include drinking baking soda (sodium bicarbonate) added to water or drinking other fluids such as baking soda mixed with cream of tartar and water.  Although these liquids create temporary relief by neutralizing, washing away or buffering acids, eventually they aggravate the situation by adding gas and fluid to your stomach, increasing pressure and causing more acid reflux. Further, adding more sodium to your diet may increase your blood pressure and add stress to your heart, and excessive bicarbonate ingestion can alter the acid-base balance in your body.   Gastritis  Gastritis is an inflammation (the body's way of reacting to injury and/or infection) of the stomach. It is often caused by viral or bacterial (germ) infections. It can also be caused BY ASPIRIN, BC/GOODY POWDER'S, (IBUPROFEN) MOTRIN, OR ALEVE (NAPROXEN), chemicals (including alcohol), SPICY FOODS, and medications. This illness may be associated with generalized malaise (feeling tired, not well), UPPER ABDOMINAL STOMACH cramps, and fever. One common bacterial cause of gastritis is an organism known as H. Pylori. This can be treated with antibiotics.    Low-Fat Diet  BREADS, CEREALS, PASTA, RICE, DRIED PEAS, AND BEANS These products are high in carbohydrates and most are low in fat. Therefore, they can be increased in the diet as substitutes for fatty foods. They too, however, contain calories and should not be eaten in excess. Cereals can be eaten for snacks as well as for breakfast.  Include foods that contain fiber (fruits, vegetables, whole grains, and legumes). Research shows that fiber may lower blood cholesterol levels, especially the water-soluble fiber found in fruits, vegetables, oat products, and legumes.  FRUITS AND VEGETABLES It is good to eat  fruits and vegetables. Besides being sources of fiber, both are rich in vitamins and some minerals. They help you get the daily allowances of these nutrients. Fruits and vegetables can be used for snacks and desserts.  MEATS Limit lean meat, chicken, [place], and fish to no more than 6 ounces per day.  Beef, Pork, and Lamb Use lean cuts of beef, pork, and lamb. Lean cuts include:  Extra-lean ground beef.  Arm roast.  Sirloin tip.  Center-cut ham.  Round steak.  Loin chops.  Rump roast.  Tenderloin.  Trim all fat off the outside of meats before cooking. It is not necessary to severely decrease the intake of red meat, but lean choices should be made. Lean meat is rich in protein and contains a highly absorbable form of iron. Premenopausal women, in particular, should avoid reducing lean red meat because this could increase the risk for low red blood cells (iron-deficiency anemia).  Chicken and [place] These are good sources of protein. The fat of poultry can be reduced by removing the skin and underlying fat layers before cooking. Chicken and [place] can be substituted for lean red meat in  the diet. Poultry should not be fried or covered with high-fat sauces.  Fish and Shellfish Fish is a good source of protein. Shellfish contain cholesterol, but they usually are low in saturated fatty acids. The preparation of fish is important. Like chicken and [place], they should not be fried or covered with high-fat sauces.  EGGS Egg whites contain no fat or cholesterol. They can be eaten often. Try 1 to 2 egg whites instead of whole eggs in recipes or use egg substitutes that do not contain yolk.  MILK AND DAIRY PRODUCTS Use skim or 1% milk instead of 2% or whole milk. Decrease whole milk, natural, and processed cheeses. Use nonfat or low-fat (2%) cottage cheese or low-fat cheeses made from vegetable oils. Choose nonfat or low-fat (1 to 2%) yogurt. Experiment with evaporated skim milk in recipes that call  for heavy cream. Substitute low-fat yogurt or low-fat cottage cheese for sour cream in dips and salad dressings. Have at least 2 servings of low-fat dairy products, such as 2 glasses of skim (or 1%) milk each day to help get your daily calcium intake.  FATS AND OILS Reduce the total intake of fats, especially saturated fat. Butterfat, lard, and beef fats are high in saturated fat and cholesterol. These should be avoided as much as possible. Vegetable fats do not contain cholesterol, but certain vegetable fats, such as coconut oil, palm oil, and palm kernel oil are very high in saturated fats. These should be limited. These fats are often used in bakery goods, processed foods, popcorn, oils, and nondairy creamers. Vegetable shortenings and some peanut butters contain hydrogenated oils, which are also saturated fats. Read the labels on these foods and check for saturated vegetable oils.  Unsaturated vegetable oils and fats do not raise blood cholesterol. However, they should be limited because they are fats and are high in calories. Total fat should still be limited to 30% of your daily caloric intake. Desirable liquid vegetable oils are corn oil, cottonseed oil, olive oil, canola oil, safflower oil, soybean oil, and sunflower oil. Peanut oil is not as good, but small amounts are acceptable. Buy a heart-healthy tub margarine that has no partially hydrogenated oils in the ingredients. Mayonnaise and salad dressings often are made from unsaturated fats, but they should also be limited because of their high calorie and fat content. Seeds, nuts, peanut butter, olives, and avocados are high in fat, but the fat is mainly the unsaturated type. These foods should be limited mainly to avoid excess calories and fat.  OTHER EATING TIPS Snacks  Most sweets should be limited as snacks. They tend to be rich in calories and fats, and their caloric content outweighs their nutritional value. Some good choices in snacks are  graham crackers, melba toast, soda crackers, bagels (no egg), English muffins, fruits, and vegetables. These snacks are preferable to snack crackers, [place] fries, and chips. Popcorn should be air-popped or cooked in small amounts of liquid vegetable oil.  Desserts Eat fruit, low-fat yogurt, and fruit ices instead of pastries, cake, and cookies. Sherbet, angel food cake, gelatin dessert, frozen low-fat yogurt, or other frozen products that do not contain saturated fat (pure fruit juice bars, frozen ice pops) are also acceptable.   COOKING METHODS Choose those methods that use little or no fat. They include: Poaching.  Braising.  Steaming.  Grilling.  Baking.  Stir-frying.  Broiling.  Microwaving.  Foods can be cooked in a nonstick pan without added fat, or use a nonfat cooking spray in  regular cookware. Limit fried foods and avoid frying in saturated fat. Add moisture to lean meats by using water, broth, cooking wines, and other nonfat or low-fat sauces along with the cooking methods mentioned above. Soups and stews should be chilled after cooking. The fat that forms on top after a few hours in the refrigerator should be skimmed off. When preparing meals, avoid using excess salt. Salt can contribute to raising blood pressure in some people.  EATING AWAY FROM HOME Order entres, potatoes, and vegetables without sauces or butter. When meat exceeds the size of a deck of cards (3 to 4 ounces), the rest can be taken home for another meal. Choose vegetable or fruit salads and ask for low-calorie salad dressings to be served on the side. Use dressings sparingly. Limit high-fat toppings, such as bacon, crumbled eggs, cheese, sunflower seeds, and olives. Ask for heart-healthy tub margarine instead of butter.

## 2015-10-01 NOTE — Progress Notes (Signed)
REVIEWED-NO ADDITIONAL RECOMMENDATIONS. 

## 2015-10-02 ENCOUNTER — Encounter: Payer: Self-pay | Admitting: Gastroenterology

## 2015-10-04 ENCOUNTER — Encounter (HOSPITAL_COMMUNITY): Payer: Self-pay | Admitting: Gastroenterology

## 2015-10-16 ENCOUNTER — Telehealth: Payer: Self-pay | Admitting: Gastroenterology

## 2015-10-16 NOTE — Telephone Encounter (Signed)
LMOM to call.

## 2015-10-16 NOTE — Telephone Encounter (Signed)
Please call pt. HER stomach Bx shows gastritis.  HER SMALL BOWEL BIOPSIES ARE NORMAL. HER UPPER ABDOMINAL PAIN IS MOST LIKELY DUE TO REFLUX AND GASTRITIS.    DRINK WATER TO KEEP YOUR URINE LIGHT YELLOW.  CONTINUE YOUR WEIGHT LOSS EFFORTS. LOSE TEN POUNDS.  FOLLOW A LOW FAT DIET. MEATS SHOULD BE BAKED, BROILED, OR BOILED. AVOID FRIED FOODS.   AVOID TRIGGERS FOR REFLUX and MINIMIZE EXPOSURE TO THINGS THAT CAUSE gastritis.   FOLLOW UP IN 4 MOS E30 GASTRITIS/GERD/UPPER ABDOMINAL PAIN.

## 2015-10-16 NOTE — Telephone Encounter (Signed)
OV made °

## 2015-10-16 NOTE — Telephone Encounter (Signed)
Pt is aware of results. 

## 2016-01-02 ENCOUNTER — Telehealth: Payer: Self-pay | Admitting: Gastroenterology

## 2016-01-02 ENCOUNTER — Ambulatory Visit: Payer: PRIVATE HEALTH INSURANCE | Admitting: Gastroenterology

## 2016-01-02 ENCOUNTER — Encounter: Payer: Self-pay | Admitting: Gastroenterology

## 2016-01-02 NOTE — Telephone Encounter (Signed)
PT WAS A NO SHOW AND LETTER SENT  °

## 2016-02-01 ENCOUNTER — Ambulatory Visit (INDEPENDENT_AMBULATORY_CARE_PROVIDER_SITE_OTHER): Payer: PRIVATE HEALTH INSURANCE | Admitting: Gastroenterology

## 2016-02-01 ENCOUNTER — Encounter: Payer: Self-pay | Admitting: Gastroenterology

## 2016-02-01 ENCOUNTER — Other Ambulatory Visit: Payer: Self-pay | Admitting: Gastroenterology

## 2016-02-01 VITALS — BP 140/94 | HR 68 | Temp 98.8°F | Ht 64.0 in | Wt 179.2 lb

## 2016-02-01 DIAGNOSIS — R1011 Right upper quadrant pain: Secondary | ICD-10-CM | POA: Diagnosis not present

## 2016-02-01 MED ORDER — PANTOPRAZOLE SODIUM 40 MG PO TBEC: 40.0000 mg | DELAYED_RELEASE_TABLET | Freq: Every day | ORAL | 3 refills | Status: DC

## 2016-02-01 NOTE — Progress Notes (Signed)
CC'ED TO PCP 

## 2016-02-01 NOTE — Progress Notes (Signed)
Referring Provider: Redmond School, MD Primary Care Physician:  Glo Herring., MD  Primary GI: Dr. Oneida Alar   Chief Complaint  Patient presents with  . Follow-up    still having abd pain    HPI:   [name] is a 35 y.o. female presenting today with a history of RUQ pain and GERD. EGD completed with normal esophagus and mild gastritis. Underwent HIDA April 2017. No pain with HIDA. March 2017 hepatic steatosis on outside ultrasound. Has taken NSAIDs routinely. Protonix prescribed and asked to avoid NSAIDs. Here for follow-up.   Persistent RUQ pain, not improved. Unsure if worsened with eating. Will come and go without aggravating factors. Waxing and wanes. Will last 15 minutes or more. Will sometimes wake her up at night. She is unsure if related to eating/drinking. Feels like "something is there". Makes her feel like she is "crazy". Will sometimes have to hold it to make it feel better. No constipation or diarrhea. GERD symptoms are better. No nausea or vomiting.     Past Medical History:  Diagnosis Date  . Dysrhythmia    followed by PCP in [place]  . HTN (hypertension)   . Hypothyroidism   . Insomnia   . Seizures ([place])    no seizure episodes in "years"    Past Surgical History:  Procedure Laterality Date  . BILATERAL SALPINGECTOMY Bilateral 12/12/2013   Procedure: BILATERAL SALPINGECTOMY;  Surgeon: Margarette Asal, MD;  Location: [facility];  Service: Gynecology;  Laterality: Bilateral;  . ESOPHAGOGASTRODUODENOSCOPY N/A 10/01/2015   Dr. Oneida Alar: normal esophagus, mild gastritis, duodenal diverticulum   . LAPAROSCOPIC ASSISTED VAGINAL HYSTERECTOMY N/A 12/12/2013   uterine fibroids  . LEEP      Current Outpatient Prescriptions  Medication Sig Dispense Refill  . acetaminophen (TYLENOL) 500 MG tablet Take 1,000 mg by mouth every 6 (six) hours as needed.    . lamoTRIgine (LAMICTAL) 100 MG tablet Take 100 mg by mouth daily.    . metoprolol tartrate (LOPRESSOR) 25 MG  tablet Take 25 mg by mouth daily.    . pantoprazole (PROTONIX) 40 MG tablet Take 1 tablet (40 mg total) by mouth daily. 30 minutes before the first meal of the day 90 tablet 3  . tiZANidine (ZANAFLEX) 4 MG capsule Take 4 mg by mouth 3 (three) times daily as needed for muscle spasms.    Marland Kitchen zolpidem (AMBIEN) 10 MG tablet Take 10 mg by mouth at bedtime as needed for sleep.     No current facility-administered medications for this visit.     Allergies as of 02/01/2016 - Review Complete 02/01/2016  Allergen Reaction Noted  . Albuterol Hives and Shortness Of Breath 12/09/2013    Family History  Problem Relation Age of Onset  . Hypertension Father   . Diabetes Father   . Colon cancer Neg Hx     Social History   Social History  . Marital status: Married    Spouse name: N/A  . Number of children: N/A  . Years of education: N/A   Occupational History  . RN     [employer]    Social History Main Topics  . Smoking status: Former Research scientist (life sciences)  . Smokeless tobacco: None  . Alcohol use No  . Drug use: No  . Sexual activity: Not Asked   Other Topics Concern  . None   Social History Narrative  . None    Review of Systems: As mentioned in HPI   Physical Exam: BP (!) 140/94  Pulse 68   Temp 98.8 F (37.1 C) (Oral)   Ht 5\' 4"  (1.626 m)   Wt 179 lb 3.2 oz (81.3 kg)   LMP 05/05/2013   BMI 30.76 kg/m  General:   Alert and oriented. No distress noted. Pleasant and cooperative.  Head:  Normocephalic and atraumatic. Eyes:  Conjuctiva clear without scleral icterus. Abdomen:  +BS, soft, non-tender and non-distended. No rebound or guarding. No HSM or masses noted. Points to right rib cage as source of pain and right below rib cage but no tenderness to palpation  Msk:  Symmetrical without gross deformities. Normal posture. Extremities:  Without edema. Neurologic:  Alert and  oriented x4;  grossly normal neurologically. Psych:  Alert and cooperative. Normal mood and affect.

## 2016-02-01 NOTE — Patient Instructions (Signed)
Please have blood work done today.   We have ordered a CT scan in the near future.   Continue Protonix each morning, 30 minutes before breakfast.   We will see what the results show and go from there.

## 2016-02-01 NOTE — Assessment & Plan Note (Signed)
35 year old female with chronic RUQ pain, EGD with gastritis, obvious improvement in GERD symptoms after cessation of NSAIDs and starting Protonix. However, RUQ pain is persistent despite these measures. On physical exam she actually points to her rib cage and just inferior to rib cage as location of discomfort. US abdomen with fatty liver and HIDA normal. No reproduction of symptoms with HIDA. No recent LFTs on file. Doubt biliary etiology. Unclear source at this time. Possible musculoskeletal component. Need to check CBC, CMP, proceed with CT to rule out occult process, and then re-evaluate from there. Continue Protonix once daily and NSAID cessation as she is doing. Further recommendations to follow.

## 2016-02-02 LAB — CBC WITH DIFFERENTIAL/PLATELET
BASOS: 0 %
Basophils Absolute: 0 10*3/uL (ref 0.0–0.2)
EOS (ABSOLUTE): 0.2 10*3/uL (ref 0.0–0.4)
Eos: 2 %
Hematocrit: 39.2 % (ref 34.0–46.6)
Hemoglobin: 13.2 g/dL (ref 11.1–15.9)
IMMATURE GRANS (ABS): 0 10*3/uL (ref 0.0–0.1)
Immature Granulocytes: 0 %
LYMPHS ABS: 5 10*3/uL — AB (ref 0.7–3.1)
LYMPHS: 47 %
MCH: 29.9 pg (ref 26.6–33.0)
MCHC: 33.7 g/dL (ref 31.5–35.7)
MCV: 89 fL (ref 79–97)
Monocytes Absolute: 1 10*3/uL — ABNORMAL HIGH (ref 0.1–0.9)
Monocytes: 9 %
NEUTROS ABS: 4.4 10*3/uL (ref 1.4–7.0)
Neutrophils: 42 %
PLATELETS: 318 10*3/uL (ref 150–379)
RBC: 4.42 x10E6/uL (ref 3.77–5.28)
RDW: 11.9 % — ABNORMAL LOW (ref 12.3–15.4)
WBC: 10.6 10*3/uL (ref 3.4–10.8)

## 2016-02-02 LAB — COMPREHENSIVE METABOLIC PANEL
A/G RATIO: 1.7 (ref 1.2–2.2)
ALBUMIN: 4.7 g/dL (ref 3.5–5.5)
ALT: 42 IU/L — AB (ref 0–32)
AST: 27 IU/L (ref 0–40)
Alkaline Phosphatase: 72 IU/L (ref 39–117)
BILIRUBIN TOTAL: 0.4 mg/dL (ref 0.0–1.2)
BUN / CREAT RATIO: 13 (ref 9–23)
BUN: 10 mg/dL (ref 6–20)
CHLORIDE: 102 mmol/L (ref 96–106)
CO2: 24 mmol/L (ref 18–29)
Calcium: 10.1 mg/dL (ref 8.7–10.2)
Creatinine, Ser: 0.76 mg/dL (ref 0.57–1.00)
GFR calc non Af Amer: 101 mL/min/{1.73_m2} (ref >59)
GFR, EST AFRICAN AMERICAN: 117 mL/min/{1.73_m2} (ref >59)
Globulin, Total: 2.8 g/dL (ref 1.5–4.5)
Glucose: 87 mg/dL (ref 65–99)
POTASSIUM: 4.5 mmol/L (ref 3.5–5.2)
Sodium: 141 mmol/L (ref 134–144)
TOTAL PROTEIN: 7.5 g/dL (ref 6.0–8.5)

## 2016-02-04 ENCOUNTER — Ambulatory Visit (HOSPITAL_COMMUNITY)
Admission: RE | Admit: 2016-02-04 | Discharge: 2016-02-04 | Disposition: A | Discharge status: Home / Self Care | Payer: PRIVATE HEALTH INSURANCE | Source: Ambulatory Visit | Attending: Gastroenterology | Admitting: Gastroenterology

## 2016-02-04 DIAGNOSIS — E2749 Other adrenocortical insufficiency: Secondary | ICD-10-CM | POA: Insufficient documentation

## 2016-02-04 DIAGNOSIS — R1011 Right upper quadrant pain: Secondary | ICD-10-CM | POA: Diagnosis not present

## 2016-02-04 DIAGNOSIS — K5900 Constipation, unspecified: Secondary | ICD-10-CM | POA: Insufficient documentation

## 2016-02-04 MED ORDER — IOPAMIDOL (ISOVUE-300) INJECTION 61%
100.0000 mL | Freq: Once | INTRAVENOUS | Status: AC | PRN
  Administered 2016-02-04: 100 mL via INTRAVENOUS

## 2016-02-12 NOTE — Progress Notes (Signed)
No anemia on CBC. Liver numbers reviewed. ALT very marginally elevated at 42, non-specific. AST normal. All others normal. CT showed moderate stool throughout the colon. This could potentially play a role in discomfort. Would have her start taking supplemental fiber daily, increase water intake, and see if she notices a difference overall. IF NOT, we could proceed with a HIDA scan.

## 2016-02-14 NOTE — Progress Notes (Signed)
LMOM to call.

## 2016-02-15 NOTE — Progress Notes (Signed)
LMOM to call and letter mailed to pt also to call.

## 2016-02-28 ENCOUNTER — Telehealth: Payer: Self-pay

## 2016-02-28 NOTE — Telephone Encounter (Signed)
Pt left Vm that she was calling for results, I returned her call and got VM and told her to please call.

## 2017-03-05 IMAGING — CT CT ABD-PELV W/ CM
2 of 4 series · 16 of 46 positions shown, 18 images · IV contrast (APPLIED)
Comparison: CT abdomen pelvis 02/17/2013

CLINICAL DATA: Right upper quadrant pain

EXAM:
CT ABDOMEN AND PELVIS WITH CONTRAST
TECHNIQUE: Multidetector CT imaging of the abdomen and pelvis was performed
using the standard protocol following bolus administration of
intravenous contrast.
CONTRAST:  100mL XMITYF-OYY IOPAMIDOL (XMITYF-OYY) INJECTION 61%

[Series 2: axial st · axial · 0.68mm/px · z∈[-533,-93]mm · 13 of 96 slices shown, 15 images]
[im 4/96  soft-tissue]
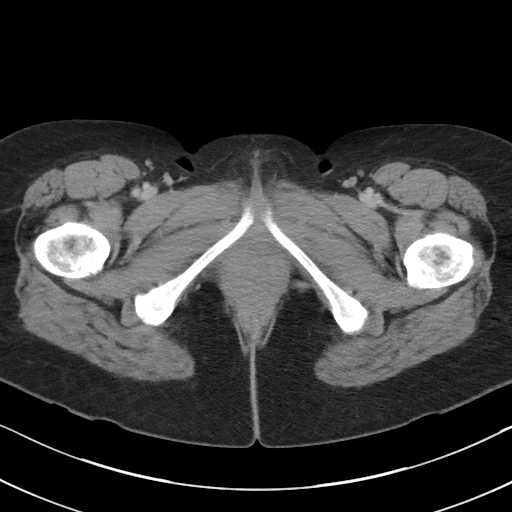
[im 4/96  bone]
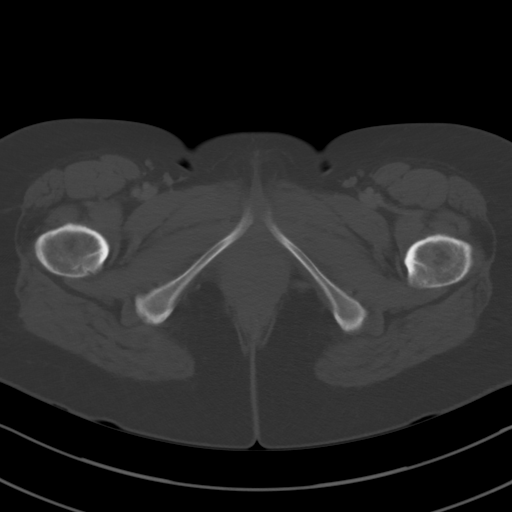
[im 12/96  soft-tissue]
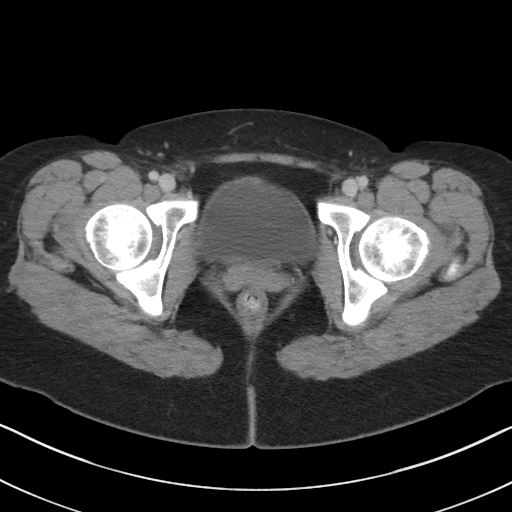
[im 20/96  soft-tissue]
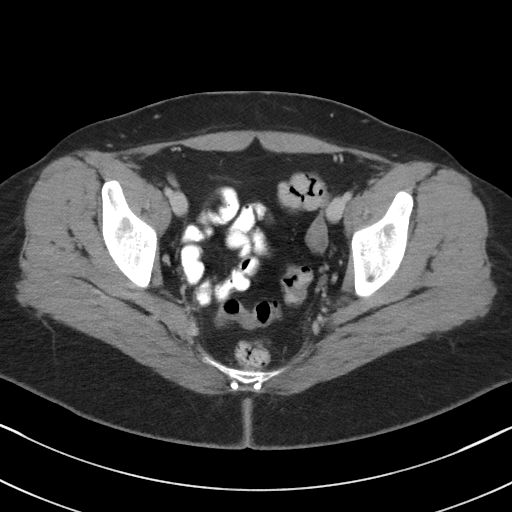
[im 28/96  soft-tissue]
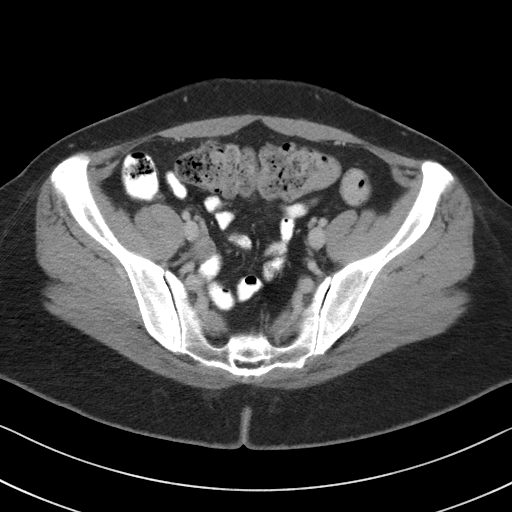
[im 32/96  soft-tissue]
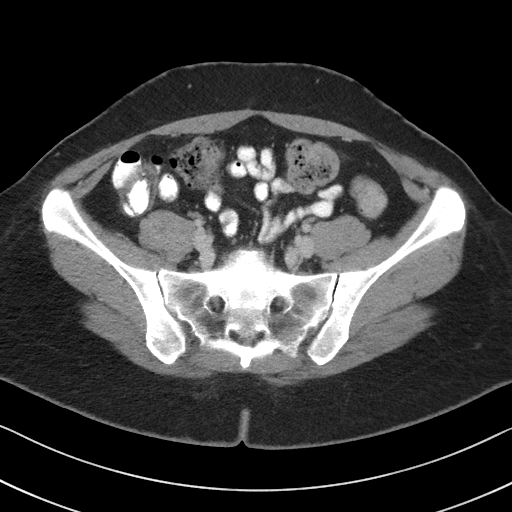
[im 40/96  soft-tissue]
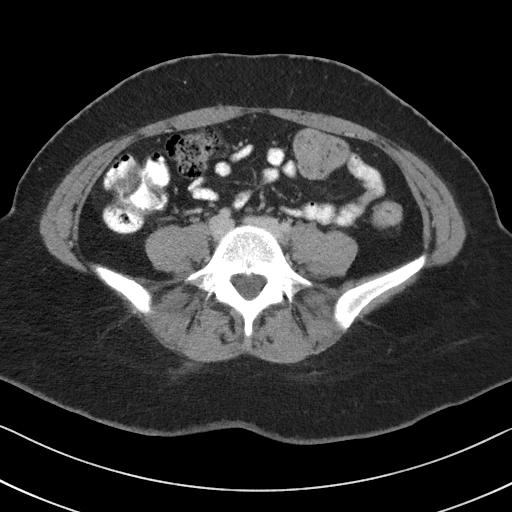
[im 48/96  soft-tissue]
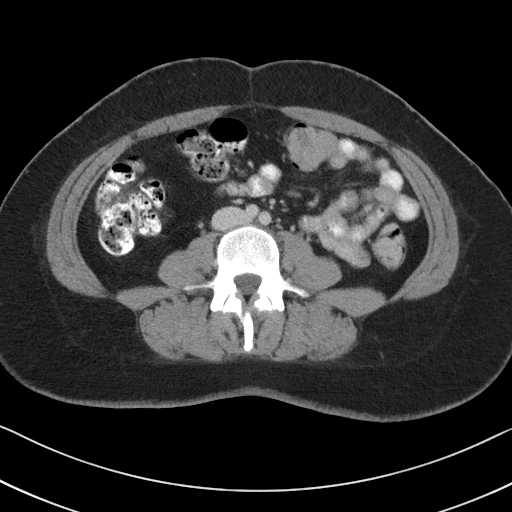
[im 56/96  soft-tissue]
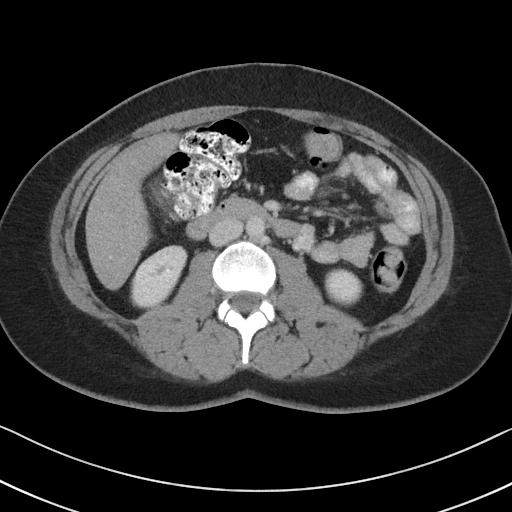
[im 64/96  soft-tissue]
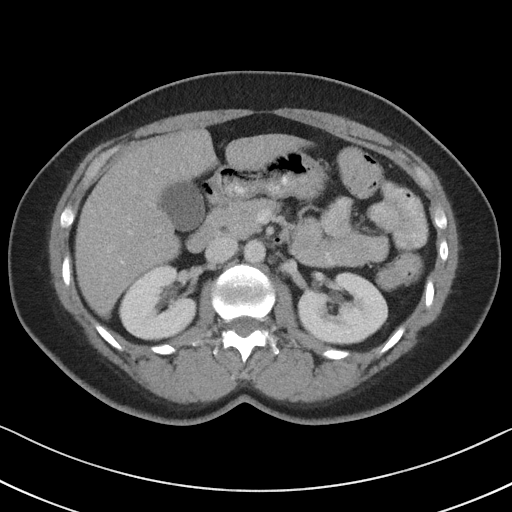
[im 64/96  bone]
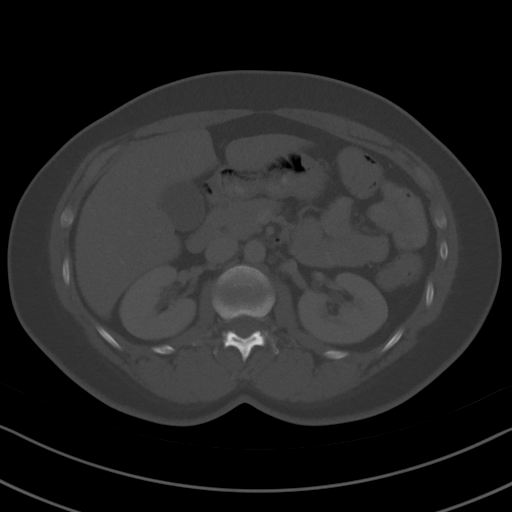
[im 68/96  soft-tissue]
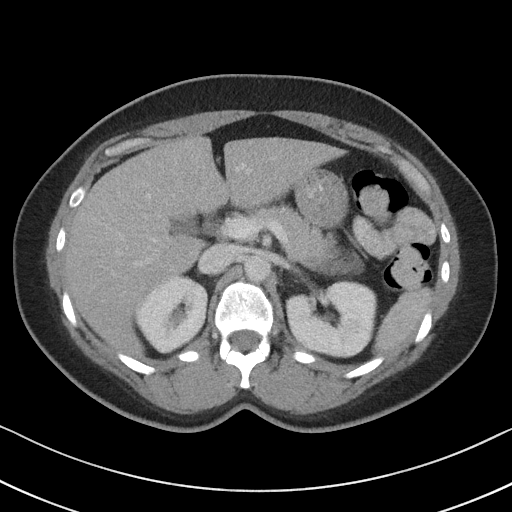
[im 76/96  soft-tissue]
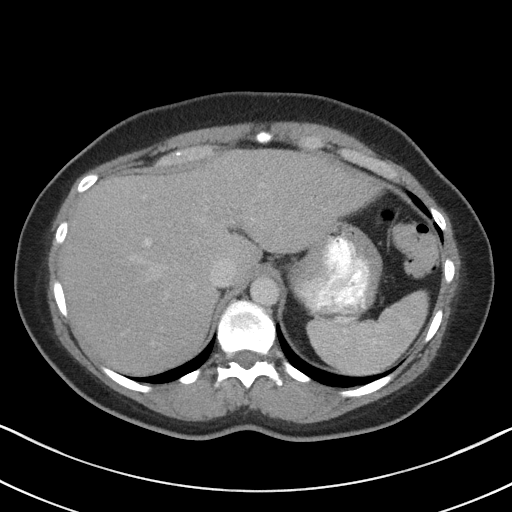
[im 84/96  soft-tissue]
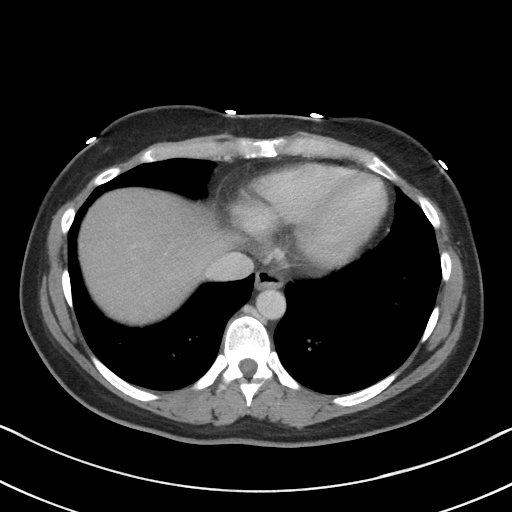
[im 92/96  soft-tissue]
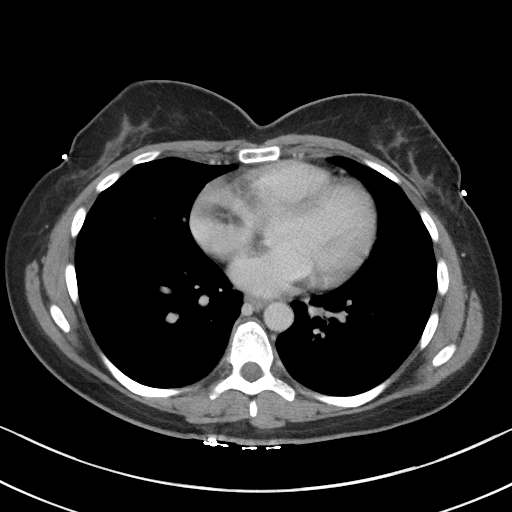

[Series 4: coronal st · coronal · 0.70mm/px · 3 of 101 slices shown]
[im 34/101  soft-tissue]
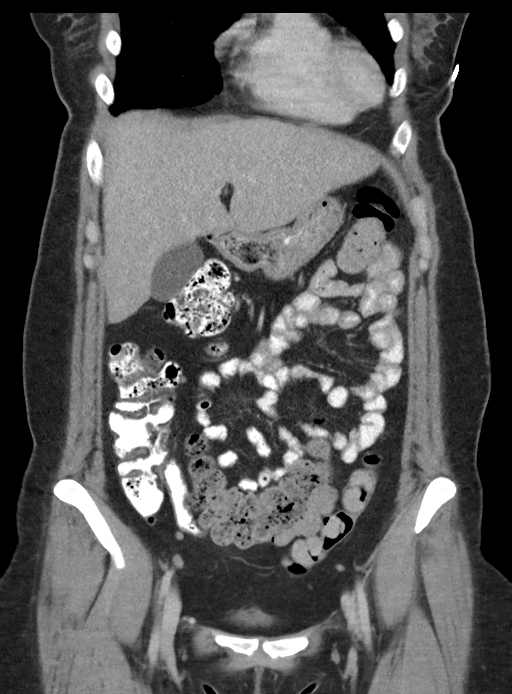
[im 45/101  soft-tissue]
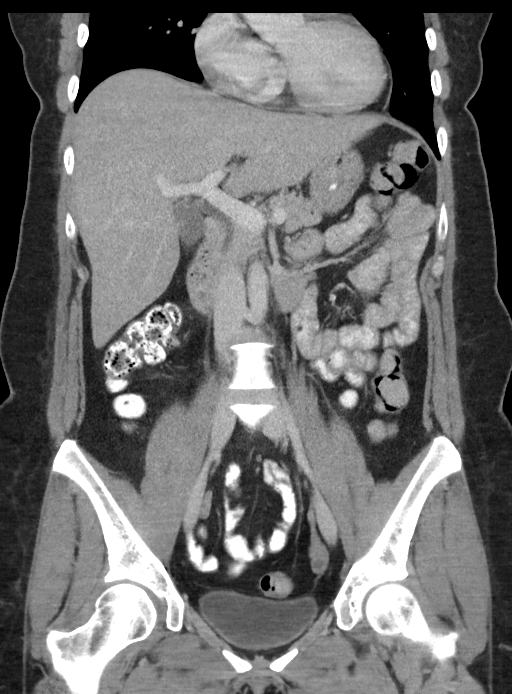
[im 56/101  soft-tissue]
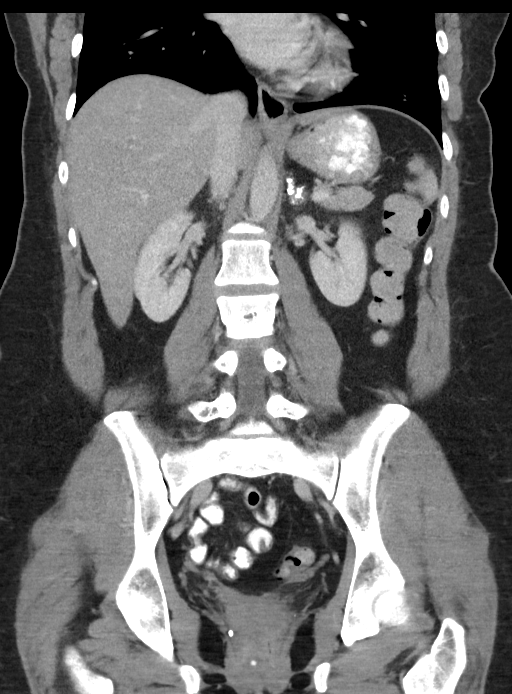

[16 of 46 positions shown; findings below may reference images not displayed]

FINDINGS: Lower chest: Lung bases clear without infiltrate or effusion. Heart
size within normal limits.

Hepatobiliary: Normal liver.  Gallbladder and bile ducts normal.

Pancreas: Normal

Spleen: Normal

Adrenals/Urinary Tract: Bilateral adrenal calcification unchanged.
No associated adrenal mass. Kidneys enhance symmetrically. No renal
mass or obstruction or stone. Urinary bladder normal.

Stomach/Bowel: Normal stomach and duodenum. Negative for bowel
obstruction. Moderate stool throughout the colon. Normal appendix.
Mild diverticulosis in the sigmoid colon. Negative for
diverticulitis.

Vascular/Lymphatic: Negative

Reproductive: Hysterectomy.  No pelvic mass.

Other: No free fluid.  Negative for hernia.

Musculoskeletal: Negative
IMPRESSION: No acute abnormality. Stable bilateral adrenal calcification which
may be due to prior adrenal infection or hemorrhage.

Constipation without bowel obstruction.  Normal appendix.

## 2018-07-10 ENCOUNTER — Encounter (HOSPITAL_COMMUNITY): Payer: Self-pay

## 2018-07-10 ENCOUNTER — Emergency Department (HOSPITAL_COMMUNITY)
Admission: EM | Admit: 2018-07-10 | Discharge: 2018-07-10 | Disposition: A | Discharge status: Home / Self Care | Payer: Commercial Managed Care - PPO | Attending: Emergency Medicine | Admitting: Emergency Medicine

## 2018-07-10 ENCOUNTER — Other Ambulatory Visit: Payer: Self-pay

## 2018-07-10 ENCOUNTER — Emergency Department (HOSPITAL_COMMUNITY): Payer: Commercial Managed Care - PPO

## 2018-07-10 DIAGNOSIS — Z79899 Other long term (current) drug therapy: Secondary | ICD-10-CM | POA: Insufficient documentation

## 2018-07-10 DIAGNOSIS — I10 Essential (primary) hypertension: Secondary | ICD-10-CM | POA: Diagnosis not present

## 2018-07-10 DIAGNOSIS — E039 Hypothyroidism, unspecified: Secondary | ICD-10-CM | POA: Insufficient documentation

## 2018-07-10 DIAGNOSIS — Z87891 Personal history of nicotine dependence: Secondary | ICD-10-CM | POA: Insufficient documentation

## 2018-07-10 DIAGNOSIS — S6992XA Unspecified injury of left wrist, hand and finger(s), initial encounter: Secondary | ICD-10-CM | POA: Diagnosis present

## 2018-07-10 DIAGNOSIS — Y999 Unspecified external cause status: Secondary | ICD-10-CM | POA: Insufficient documentation

## 2018-07-10 DIAGNOSIS — W1789XA Other fall from one level to another, initial encounter: Secondary | ICD-10-CM | POA: Diagnosis not present

## 2018-07-10 DIAGNOSIS — Y9389 Activity, other specified: Secondary | ICD-10-CM | POA: Insufficient documentation

## 2018-07-10 DIAGNOSIS — S93402A Sprain of unspecified ligament of left ankle, initial encounter: Secondary | ICD-10-CM | POA: Diagnosis not present

## 2018-07-10 DIAGNOSIS — Y92812 Truck as the place of occurrence of the external cause: Secondary | ICD-10-CM | POA: Diagnosis not present

## 2018-07-10 DIAGNOSIS — S60222A Contusion of left hand, initial encounter: Secondary | ICD-10-CM | POA: Diagnosis not present

## 2018-07-10 MED ORDER — HYDROCODONE-ACETAMINOPHEN 5-325 MG PO TABS
1.0000 | ORAL_TABLET | Freq: Once | ORAL | Status: AC
  Administered 2018-07-10: 1 via ORAL
  Filled 2018-07-10: qty 1

## 2018-07-10 MED ORDER — HYDROCODONE-ACETAMINOPHEN 5-325 MG PO TABS: 1.0000 | ORAL_TABLET | Freq: Four times a day (QID) | ORAL | 0 refills | Status: DC | PRN

## 2018-07-10 MED ORDER — IBUPROFEN 600 MG PO TABS: 600.0000 mg | ORAL_TABLET | Freq: Four times a day (QID) | ORAL | 0 refills | Status: DC | PRN

## 2018-07-10 NOTE — ED Notes (Signed)
Patient transported to X-ray 

## 2018-07-10 NOTE — ED Triage Notes (Signed)
Patient fell off the back of a truck landed on her ankle wrong and hit wrist also. States she cannot bend either one. No obvious deformity noted.

## 2018-07-10 NOTE — ED Notes (Signed)
Pt returned from xray

## 2018-07-10 NOTE — ED Provider Notes (Signed)
[facility] EMERGENCY DEPARTMENT Provider Note   CSN: [id number] Arrival date & time: 07/10/18  2004    History   Chief Complaint Chief Complaint  Patient presents with  . Ankle Injury    Left   . Wrist Injury    Left    HPI [name] is a 38 y.o. female.     HPI Roughly 2 hours prior to presentation patient jumped off the back of a truck and landed on her left outstretched hand and twisted her left ankle.  She is complaining of pain to the thenar eminence of the left hand and the medial surface of the left ankle.  Denies any numbness or weakness.  No head injury. Past Medical History:  Diagnosis Date  . Dysrhythmia    followed by PCP in [place]  . HTN (hypertension)   . Hypothyroidism   . Insomnia   . Seizures ([place])    no seizure episodes in "years"    Patient Active Problem List   Diagnosis Date Noted  . Esophageal reflux   . GERD (gastroesophageal reflux disease) 09/28/2015  . RUQ pain 09/28/2015  . Fatty liver 09/28/2015  . Dysmenorrhea 12/12/2013    Past Surgical History:  Procedure Laterality Date  . BILATERAL SALPINGECTOMY Bilateral 12/12/2013   Procedure: BILATERAL SALPINGECTOMY;  Surgeon: Margarette Asal, MD;  Location: [facility];  Service: Gynecology;  Laterality: Bilateral;  . ESOPHAGOGASTRODUODENOSCOPY N/A 10/01/2015   Dr. Oneida Alar: normal esophagus, mild gastritis, duodenal diverticulum   . LAPAROSCOPIC ASSISTED VAGINAL HYSTERECTOMY N/A 12/12/2013   uterine fibroids  . LEEP       OB History   No obstetric history on file.      Home Medications    Prior to Admission medications   Medication Sig Start Date End Date Taking? Authorizing Provider  acetaminophen (TYLENOL) 500 MG tablet Take 1,000 mg by mouth every 6 (six) hours as needed.    [provider]  HYDROcodone-acetaminophen (NORCO) 5-325 MG tablet Take 1 tablet by mouth every 6 (six) hours as needed for severe pain. 07/10/18   Julianne Rice, MD  ibuprofen  (ADVIL,MOTRIN) 600 MG tablet Take 1 tablet (600 mg total) by mouth every 6 (six) hours as needed. 07/10/18   Julianne Rice, MD  lamoTRIgine (LAMICTAL) 100 MG tablet Take 100 mg by mouth daily.    [provider]  metoprolol tartrate (LOPRESSOR) 25 MG tablet Take 25 mg by mouth daily.    [provider]  pantoprazole (PROTONIX) 40 MG tablet Take 1 tablet (40 mg total) by mouth daily. 30 minutes before the first meal of the day 02/01/16   Annitta Needs, NP  tiZANidine (ZANAFLEX) 4 MG capsule Take 4 mg by mouth 3 (three) times daily as needed for muscle spasms.    [provider]  zolpidem (AMBIEN) 10 MG tablet Take 10 mg by mouth at bedtime as needed for sleep.    [provider]    Family History Family History  Problem Relation Age of Onset  . Hypertension Father   . Diabetes Father   . Colon cancer Neg Hx     Social History Social History   Tobacco Use  . Smoking status: Former Research scientist (life sciences)  . Smokeless tobacco: Never Used  Substance Use Topics  . Alcohol use: No    Alcohol/week: 0.0 standard drinks  . Drug use: No     Allergies   Albuterol   Review of Systems Review of Systems  Musculoskeletal: Positive for arthralgias.  Neurological: Negative for weakness and numbness.  All other systems reviewed and are negative.    Physical Exam Updated Vital Signs BP (!) 117/96 (BP Location: Right Arm)   Pulse 90   Temp 97.7 F (36.5 C) (Oral)   Resp 17   Ht 5\' 5"  (1.651 m)   Wt 86.2 kg   LMP 05/05/2013   SpO2 99%   BMI 31.62 kg/m   Physical Exam Vitals signs and nursing note reviewed.  Constitutional:      Appearance: Normal appearance. She is well-developed.  HENT:     Head: Normocephalic and atraumatic.  Eyes:     Pupils: Pupils are equal, round, and reactive to light.  Neck:     Musculoskeletal: Normal range of motion and neck supple.  Pulmonary:     Effort: Pulmonary effort is normal.  Musculoskeletal: Normal range of  motion.        General: Tenderness present.     Comments: Patient with obvious contusion and tenderness to palpation over the left hand thenar eminence.  Pain with range of motion of the thumb.  No snuffbox tenderness.  No wrist tenderness.  No elbow tenderness.  Patient also has tenderness to palpation over the medial surface of the left ankle.  No proximal fibular tenderness.  Distal cap refill intact.  Skin:    General: Skin is warm and dry.     Findings: No erythema or rash.  Neurological:     General: No focal deficit present.     Mental Status: She is alert and oriented to person, place, and time.     Comments: Sensation intact.  5/5 motor in all extremities.  Psychiatric:        Behavior: Behavior normal.      ED Treatments / Results  Labs (all labs ordered are listed, but only abnormal results are displayed) Labs Reviewed - No data to display  EKG None  Radiology Dg Ankle Complete Left  Result Date: 07/10/2018 CLINICAL DATA:  [place] off the back of a truck landing wrong on ankle and hitting wrist, cannot bend ankle, no obvious deformity EXAM: LEFT ANKLE COMPLETE - 3+ VIEW COMPARISON:  None FINDINGS: Osseous mineralization normal. Joint spaces preserved. No acute fracture, dislocation, or bone destruction. IMPRESSION: No acute osseous abnormalities. Electronically Signed   By: Lavonia Dana M.D.   On: 07/10/2018 20:51   Dg Hand Complete Left  Result Date: 07/10/2018 CLINICAL DATA:  [place] off the back of a truck landing wrong on ankle and hitting wrist, cannot bend wrist EXAM: LEFT HAND - COMPLETE 3+ VIEW COMPARISON:  None FINDINGS: Osseous mineralization normal. Joint spaces preserved. No fracture, dislocation, or bone destruction. IMPRESSION: Normal exam. Electronically Signed   By: Lavonia Dana M.D.   On: 07/10/2018 20:50    Procedures Procedures (including critical care time)  Medications Ordered in ED Medications  HYDROcodone-acetaminophen (NORCO/VICODIN) 5-325 MG per  tablet 1 tablet (has no administration in time range)     Initial Impression / Assessment and Plan / ED Course  I have reviewed the triage vital signs and the nursing notes.  Pertinent labs & imaging results that were available during my care of the patient were reviewed by me and considered in my medical decision making (see chart for details).        No evidence of injury on x-rays.  Placed in Ace wrap and wrist splint.  Will treat symptomatically with rice therapy.  Follow-up with orthopedics if symptoms persist.  Final Clinical Impressions(s) /  ED Diagnoses   Final diagnoses:  Contusion of left hand, initial encounter  Sprain of left ankle, unspecified ligament, initial encounter    ED Discharge Orders         Ordered    HYDROcodone-acetaminophen (NORCO) 5-325 MG tablet  Every 6 hours PRN     07/10/18 2100    ibuprofen (ADVIL,MOTRIN) 600 MG tablet  Every 6 hours PRN     07/10/18 2100           Julianne Rice, MD 07/10/18 2101

## 2018-07-12 ENCOUNTER — Telehealth: Payer: Self-pay | Admitting: Orthopedic Surgery

## 2018-07-12 NOTE — Telephone Encounter (Signed)
Patient called for appointment following Emergency room visit for left ankle sprain and hand contusion.  States was given ace wrap; said still unable to bear weight. Awaiting appointment advice based on current scheduling/re-scheduling clinic visits.  Please review and advice.

## 2018-07-14 NOTE — Telephone Encounter (Signed)
Called back to patient; scheduled accordingly. Patient agrees and is aware of virtual office visit appointment 07/15/18, 9:30a.m.

## 2018-07-14 NOTE — Telephone Encounter (Signed)
(  On 07/12/18, Dr Aline Brochure responded verbally that sprains will need to be cared for at home, due to office restrictions related to [facility] virus. Patient called back to relay she does not think she received a call.  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  Please advise. PH# [phone]

## 2018-07-14 NOTE — Telephone Encounter (Signed)
Schedule a virtual visit for 930 am tomorrow

## 2018-07-15 ENCOUNTER — Telehealth: Payer: Self-pay | Admitting: Orthopedic Surgery

## 2018-07-15 ENCOUNTER — Telehealth: Payer: Commercial Managed Care - PPO | Admitting: Orthopedic Surgery

## 2018-07-15 ENCOUNTER — Other Ambulatory Visit: Payer: Self-pay

## 2018-07-15 NOTE — Progress Notes (Signed)
Virtual visit cancelled patient needs to com in

## 2018-07-15 NOTE — Telephone Encounter (Signed)
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  She was on the back of a truck she went to get up and fell forward landing on her hand and hyper extending the left ankle.  She was evaluated in the emergency room x-rays of the hand and ankle were negative she was placed in a left wrist splint and an Ace wrap was placed on her left ankle and she was diagnosed with an ankle sprain.  She has been unable to fully weight-bear although the pain has gotten better she has a slight bruise on the top of the foot and ankle area with no lateral or medial bone pain except when she tries to twist her foot she has been using ice and using the Ace bandage and ambulating primarily on her toes.  Follow-up as stated on Friday regular new patient office visit

## 2018-07-16 ENCOUNTER — Encounter: Payer: Self-pay | Admitting: Orthopedic Surgery

## 2018-07-16 ENCOUNTER — Ambulatory Visit: Payer: Commercial Managed Care - PPO | Admitting: Orthopedic Surgery

## 2018-07-16 VITALS — BP 122/92 | HR 89 | Temp 97.2°F | Ht 65.0 in | Wt 190.0 lb

## 2018-07-16 DIAGNOSIS — S63502A Unspecified sprain of left wrist, initial encounter: Secondary | ICD-10-CM | POA: Diagnosis not present

## 2018-07-16 DIAGNOSIS — S9302XA Subluxation of left ankle joint, initial encounter: Secondary | ICD-10-CM

## 2018-07-16 NOTE — Progress Notes (Signed)
NEW PATIENT OFFICE VISIT  Chief Complaint  Patient presents with  . 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  She was on the back of a truck she went to get up and fell forward landing on her hand and hyper extending the left ankle.  She was evaluated in the emergency room x-rays of the hand and ankle were negative she was placed in a left wrist splint and an Ace wrap was placed on her left ankle and she was diagnosed with an ankle sprain.  She has been unable to fully weight-bear although the pain has gotten better she has a slight bruise on the top of the foot and ankle area with no lateral or medial bone pain except when she tries to twist her foot she has been using ice and using the Ace bandage and ambulating primarily on her toes.   Follow-up as stated on Friday regular new patient office visit   Review of Systems  Constitutional: Negative for chills and fever.  Musculoskeletal: Positive for joint pain. Negative for back pain.  Neurological: Negative for tingling, sensory change and focal weakness.     Past Medical History:  Diagnosis Date  . Dysrhythmia    followed by PCP in [place]  . HTN (hypertension)   . Hypothyroidism   . Insomnia   . Seizures ([place])    no seizure episodes in "years"    Past Surgical History:  Procedure Laterality Date  . BILATERAL SALPINGECTOMY Bilateral 12/12/2013   Procedure: BILATERAL SALPINGECTOMY;  Surgeon: Margarette Asal, MD;  Location: [facility];  Service: Gynecology;  Laterality: Bilateral;  . ESOPHAGOGASTRODUODENOSCOPY N/A 10/01/2015   Dr. Oneida Alar: normal esophagus, mild gastritis, duodenal diverticulum   . LAPAROSCOPIC ASSISTED VAGINAL HYSTERECTOMY N/A 12/12/2013   uterine fibroids  .  LEEP      Family History  Problem Relation Age of Onset  . Hypertension Father   . Diabetes Father   . Colon cancer Neg Hx    Social History   Tobacco Use  . Smoking status: Former Research scientist (life sciences)  . Smokeless tobacco: Never Used  Substance Use Topics  . Alcohol use: No    Alcohol/week: 0.0 standard drinks  . Drug use: No    Allergies  Allergen Reactions  . Albuterol Hives and Shortness Of Breath    No outpatient medications have been marked as taking for the 07/16/18 encounter (Office Visit) with Carole Civil, MD.    LMP 05/05/2013   Physical Exam Vitals signs and nursing note reviewed.  Constitutional:      Appearance: Normal appearance.  Neurological:     Mental Status: She is alert and oriented to person, place, and time.     Gait: Gait abnormal.     Comments: Definite limp favoring the left leg patient is actually walking on her left heel no assistive devices  Psychiatric:        Mood and Affect: Mood normal.     Ortho Exam  Right lower extremity normal range of motion no tenderness or deformity ankle joint is stable muscle tone and strength are normal neurovascular exam is intact skin shows no lesions  Left lower extremity no deformity no tenderness of the fibula or  tibia medial malleolus.  Skin is intact.  Painful range of motion dorsiflexion plantarflexion inversion eversion stable drawer test with tenderness across the front of the ankle joint and some in the anterior talofibular ligament She has weakness and pain with extreme plantarflexion no muscle tremors neurovascular exam is normal.  Surprisingly no significant swelling except across the anterior portion of the ankle joint  Imaging studies were done at an outside facility [facility]  I independently read the x-ray as  x-ray shows no fracture dislocation  Impression  Encounter Diagnoses  Name Primary?  Marland Kitchen Acquired subluxation of left ankle, initial encounter Yes  . Sprain of left wrist,  initial encounter    She most likely hyperflexed into plantarflexion and probably had an aborted subluxation of the ankle joint.   Encounter Diagnoses  Name Primary?  Marland Kitchen Acquired subluxation of left ankle, initial encounter Yes  . Sprain of left wrist, initial encounter     PLAN: (Rx., injectx, surgery, frx, mri/ct) Recommend weightbearing as tolerated in a cam walker  OOW April 23 THRU FEB 3   ANKLE EXERCISES 3 X A DAY   WBAT IN WALKER , USE RT CRUTCH IF NEEDED  CONTINUE IBUPROFEN  WEAR WRIST SPLINT UNTIL YOUR NEXT APPT   Return 1 week   No orders of the defined types were placed in this encounter.   Arther Abbott, MD  07/16/2018 9:35 AM

## 2018-07-16 NOTE — Patient Instructions (Signed)
OOW April 23 THRU FEB 3   ANKLE EXERCISES 3 X A DAY   WBAT IN [place] , USE RT CRUTCH IF NEEDED  CONTINUE IBUPROFEN  WEAR WRIST SPLINT UNTIL YOUR NEXT APPT

## 2018-07-23 ENCOUNTER — Ambulatory Visit: Payer: Commercial Managed Care - PPO | Admitting: Orthopedic Surgery

## 2018-07-23 ENCOUNTER — Encounter: Payer: Self-pay | Admitting: Orthopedic Surgery

## 2018-07-23 ENCOUNTER — Other Ambulatory Visit: Payer: Self-pay

## 2018-07-23 ENCOUNTER — Telehealth: Payer: Self-pay | Admitting: Orthopedic Surgery

## 2018-07-23 VITALS — Temp 97.6°F | Wt 198.0 lb

## 2018-07-23 DIAGNOSIS — S9302XA Subluxation of left ankle joint, initial encounter: Secondary | ICD-10-CM | POA: Insufficient documentation

## 2018-07-23 DIAGNOSIS — S9302XD Subluxation of left ankle joint, subsequent encounter: Secondary | ICD-10-CM

## 2018-07-23 DIAGNOSIS — S63502D Unspecified sprain of left wrist, subsequent encounter: Secondary | ICD-10-CM

## 2018-07-23 DIAGNOSIS — S63502A Unspecified sprain of left wrist, initial encounter: Secondary | ICD-10-CM | POA: Insufficient documentation

## 2018-07-23 NOTE — Progress Notes (Signed)
Chief Complaint  Patient presents with  . Ankle Injury    DOI 07/10/18    In person follow-up visit status post ankle injury on March 21 with left ankle subluxation.  Patient placed in cam walker treated with ibuprofen progressive weightbearing as tolerated  Left wrist sprain no complaints of pain today full range of motion  Review of systems no numbness or tingling no instability  She does have pain walking out of the boot primarily on the lateral side of the ankle and anterior ankle joint line  Physical Exam Vitals signs and nursing note reviewed.  Constitutional:      Appearance: Normal appearance.  Neurological:     Mental Status: She is alert and oriented to person, place, and time.  Psychiatric:        Mood and Affect: Mood normal.    Excellent toe weightbearing in the cam walker  Tenderness lateral border of the foot on the plantar aspect tenderness across the anterior ankle joint line stable anterior drawer test  Normal ankle range of motion the left ankle  Normal range of motion left wrist    Encounter Diagnoses  Name Primary?  Marland Kitchen Acquired subluxation of left ankle, subsequent encounter Yes  . Sprain of left wrist, subsequent encounter     virtual visit in 2 weeks continue weightbearing as tolerated in the cam walker return to work in [employer] if she can if she can out of work 2 weeks

## 2018-07-23 NOTE — Telephone Encounter (Signed)
Patient called to relay that her employer [employer], [place]) will not permit return to work with wear of Cam walker boot; therefore, the out of work note through date of next scheduled appointment, 08/11/18, has been approved.

## 2018-08-11 ENCOUNTER — Encounter: Payer: Self-pay | Admitting: Orthopedic Surgery

## 2018-08-11 ENCOUNTER — Ambulatory Visit: Payer: Commercial Managed Care - PPO | Admitting: Orthopedic Surgery

## 2018-08-11 ENCOUNTER — Other Ambulatory Visit: Payer: Self-pay

## 2018-08-11 ENCOUNTER — Ambulatory Visit (INDEPENDENT_AMBULATORY_CARE_PROVIDER_SITE_OTHER): Payer: Commercial Managed Care - PPO | Admitting: Orthopedic Surgery

## 2018-08-11 DIAGNOSIS — S9302XA Subluxation of left ankle joint, initial encounter: Secondary | ICD-10-CM

## 2018-08-11 DIAGNOSIS — S9302XD Subluxation of left ankle joint, subsequent encounter: Secondary | ICD-10-CM

## 2018-08-11 NOTE — Progress Notes (Signed)
Virtual Visit via Telephone Note  I connected with [name] on 08/11/18 at  8:30 AM EDT by telephone and verified that I am speaking with the correct person using two identifiers.   I discussed the limitations, risks, security and privacy concerns of performing an evaluation and management service by telephone and the availability of in person appointments. I also discussed with the patient that there may be a patient responsible charge related to this service. The patient expressed understanding and agreed to proceed.   Progress Note   Patient ID: [name], female   DOB: 17-May-1980, 38 y.o.   MRN: [id number]   Chief Complaint  Patient presents with  . Ankle Injury    Ankle pain left side    37 year old female injured her left ankle probably a subluxation treated with a cam walker.  She says she can walk now for exercise without the cam walker is progressed very well without any pain tenderness or swelling  Wrist no pain no swelling and no difficulty with activities of daily living    Review of Systems  Neurological: Negative for tingling.     Allergies  Allergen Reactions  . Albuterol Hives and Shortness Of Breath     LMP 05/05/2013   Physical Exam   Medical decisions:   Data   No diagnosis found.  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 The patient was provided an opportunity to ask questions and all were answered. The patient agreed with the plan and demonstrated an understanding of the instructions.   The patient was advised to call back or seek an in-person evaluation if the symptoms worsen or if the condition fails to improve as anticipated.  I  provided 5 minutes of non-face-to-face time during this encounter.   Arther Abbott, MD

## 2019-11-28 ENCOUNTER — Other Ambulatory Visit: Payer: Self-pay | Admitting: Obstetrics and Gynecology

## 2019-11-28 DIAGNOSIS — Z1231 Encounter for screening mammogram for malignant neoplasm of breast: Secondary | ICD-10-CM

## 2019-12-16 ENCOUNTER — Ambulatory Visit: Payer: Commercial Managed Care - PPO

## 2019-12-20 ENCOUNTER — Ambulatory Visit
Admission: RE | Admit: 2019-12-20 | Discharge: 2019-12-20 | Disposition: A | Discharge status: Home / Self Care | Payer: PRIVATE HEALTH INSURANCE | Source: Ambulatory Visit | Attending: Obstetrics and Gynecology | Admitting: Obstetrics and Gynecology

## 2019-12-20 ENCOUNTER — Other Ambulatory Visit: Payer: Self-pay

## 2019-12-20 DIAGNOSIS — Z1231 Encounter for screening mammogram for malignant neoplasm of breast: Secondary | ICD-10-CM

## 2020-10-10 DIAGNOSIS — U071 COVID-19: Secondary | ICD-10-CM

## 2020-10-10 HISTORY — DX: COVID-19: U07.1

## 2020-11-05 ENCOUNTER — Other Ambulatory Visit: Payer: Self-pay | Admitting: Obstetrics and Gynecology

## 2020-11-05 DIAGNOSIS — Z1231 Encounter for screening mammogram for malignant neoplasm of breast: Secondary | ICD-10-CM

## 2020-11-08 ENCOUNTER — Encounter: Payer: Self-pay | Admitting: Gynecologic Oncology

## 2020-11-09 ENCOUNTER — Telehealth: Payer: Self-pay

## 2020-11-09 NOTE — Telephone Encounter (Signed)
*  Late Entry* Spoke with Larene Beach about her referral to GYN oncology and scheduled a new patient appointment with Dr. Jeral Pinch on 11/12/20 at 0945. Patient provided with address and location. Instructed on mask policy and visitor policy. Patient verbalized understanding, in agreement of date and time.

## 2020-11-12 ENCOUNTER — Encounter: Payer: Self-pay | Admitting: Gynecologic Oncology

## 2020-11-12 ENCOUNTER — Telehealth: Payer: Self-pay | Admitting: Oncology

## 2020-11-12 ENCOUNTER — Inpatient Hospital Stay: Payer: PRIVATE HEALTH INSURANCE | Attending: Gynecologic Oncology | Admitting: Gynecologic Oncology

## 2020-11-12 ENCOUNTER — Other Ambulatory Visit: Payer: Self-pay

## 2020-11-12 VITALS — BP 138/86 | HR 71 | Temp 97.9°F | Resp 18 | Ht 65.0 in | Wt 203.9 lb

## 2020-11-12 DIAGNOSIS — G4700 Insomnia, unspecified: Secondary | ICD-10-CM | POA: Insufficient documentation

## 2020-11-12 DIAGNOSIS — C519 Malignant neoplasm of vulva, unspecified: Secondary | ICD-10-CM | POA: Diagnosis present

## 2020-11-12 DIAGNOSIS — Z79899 Other long term (current) drug therapy: Secondary | ICD-10-CM | POA: Diagnosis not present

## 2020-11-12 DIAGNOSIS — I10 Essential (primary) hypertension: Secondary | ICD-10-CM | POA: Diagnosis not present

## 2020-11-12 DIAGNOSIS — R600 Localized edema: Secondary | ICD-10-CM | POA: Diagnosis not present

## 2020-11-12 DIAGNOSIS — R140 Abdominal distension (gaseous): Secondary | ICD-10-CM | POA: Diagnosis not present

## 2020-11-12 DIAGNOSIS — E039 Hypothyroidism, unspecified: Secondary | ICD-10-CM | POA: Diagnosis not present

## 2020-11-12 NOTE — Progress Notes (Signed)
GYNECOLOGIC ONCOLOGY NEW PATIENT CONSULTATION   Patient Name: [name]  Patient Age: 40 y.o. 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  Reviewed with the patient her recent biopsy results as well as my exam today.  It is difficult to tell if there is still little residual nodule versus induration secondary to recent biopsy.  I do not see any other vulvar lesions.  I spoke with the pathologist who reviewed her biopsy specimen.  Given the specimen, it was difficult to tell whether there was superficial invasion.  Given this, I recommended that we proceed with wide local excision to help determine invasion and whether more radical procedure with lymph node assessment is warranted.  While I would not typically order imaging in someone with such a small, apparent early stage lesion, given her recent weight gain and new onset edema, I think that imaging is warranted to rule out any lymph node involvement.  I do not appreciate adenopathy on her exam today.  I have ordered a PET scan which will be scheduled by my office.  We discussed the plan for wide local excision, possible frozen section evaluation, possible partial radical excision, and any other indicated procedures.  We discussed the risks of surgery including but not limited to bleeding, need for blood transfusion, infection, damage to surrounding structures requiring repair, medical complications, and rarely death.  All the patient's questions were answered today.  She will return for preoperative visit with our nurse practitioner, Joylene John.  A copy of this note was sent to the patient's referring provider.   65 minutes of total time was spent for this patient encounter, including  preparation, face-to-face counseling with the patient and coordination of care, and documentation of the encounter.  Jeral Pinch, MD  Division of Gynecologic Oncology  Department of Obstetrics and Gynecology  [facility]  ___________________________________________  Chief Complaint: No chief complaint on file.   History of Present Illness:  [name] is a 40 y.o. y.o. female who is seen in consultation at the request of Dr. 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  Patient reports that for about 2 years, she has had a palpable lesion above her clitoris.  Initially, this was just a tiny bump, but it has increased steadily in size in the last 6-12 months.  She thought it look like a wart and given her embarrassment regarding its appearance, did not seek medical care.  It would intermittently become painful and irritated, and she would have spotting secondary to irritation from clothes and underwear rubbing the area.  She had some associated pruritus.  She would have occasional burning.  About a week before her recent visit with Dr. Matthew Saras, a portion of the lesion fell off while she was in the shower.  Exophytic periclitoral lesion excised on 7/18.  This showed squamous cell carcinoma.  Biopsy noted to be superficial and assessment of invasion cannot be completed.  The specimen measured 1 x 1 x 0.5 cm.  She reports good appetite without nausea or emesis.  She has gained about 14 pounds in the last 2 months.  Secondary to lower extremity edema, she was recently started on Lasix which has helped.  She has recently felt bloated and noted that her feet  are swollen.  She reports normal bowel and bladder function.  She endorses a history of seizure disorder although has not had a seizure in a number of years.  She takes Ambien for sleep issues.  She had a hysterectomy with bilateral salpingectomy for fibroids.  She notes occasional very light  vaginal spotting that she describes as bright red, typically after intercourse.  She underwent a LEEP a number of years ago for dysplasia and has had normal Pap smears since.  She lives in [place] with her husband and son.  She denies any tobacco or alcohol use.  PAST MEDICAL HISTORY:  Past Medical History:  Diagnosis Date   Dysrhythmia    followed by PCP in [place]   HTN (hypertension)    Hypothyroidism    Insomnia    Seizures ([place])    no seizure episodes in "years"     PAST SURGICAL HISTORY:  Past Surgical History:  Procedure Laterality Date   BILATERAL SALPINGECTOMY Bilateral 12/12/2013   Procedure: BILATERAL SALPINGECTOMY;  Surgeon: Margarette Asal, MD;  Location: [facility];  Service: Gynecology;  Laterality: Bilateral;   ESOPHAGOGASTRODUODENOSCOPY N/A 10/01/2015   Dr. Oneida Alar: normal esophagus, mild gastritis, duodenal diverticulum    LAPAROSCOPIC ASSISTED VAGINAL HYSTERECTOMY N/A 12/12/2013   uterine fibroids   LEEP      OB/GYN HISTORY:  OB History  Gravida Para Term Preterm AB Living  2 2          SAB IAB Ectopic Multiple Live Births               # Outcome Date GA Lbr Len/2nd Weight Sex Delivery Anes PTL Lv  2 Para           1 Para             Patient's last menstrual period was 05/05/2013.  Age at menarche: 1 Age at menopause: Menses stopped with her hysterectomy, is not having menopausal symptoms Hx of HRT: Denies Hx of STDs: Denies Last pap: 2022.  Patient has a remote history of an abnormal Pap approximately 19 years ago.  She had a LEEP at that time for dysplasia and reports normal Paps since History of abnormal pap smears: Yes, see above  SCREENING STUDIES:  Last mammogram: 11/2019  Last colonoscopy: n/a  MEDICATIONS: Outpatient Encounter Medications as of 11/12/2020  Medication Sig   atenolol (TENORMIN) 50 MG tablet    furosemide (LASIX) 20 MG tablet Take 20 mg by mouth.   LamoTRIgine 100 MG TB24 24 hour tablet    OVER THE COUNTER MEDICATION  Take by mouth daily. Turmeric   zolpidem (AMBIEN) 10 MG tablet Take 10 mg by mouth at bedtime as needed for sleep.   cholecalciferol (VITAMIN D3) 25 MCG (1000 UT) tablet Take 1,000 Units by mouth daily.   ibuprofen (ADVIL,MOTRIN) 600 MG tablet Take 1 tablet (600 mg total) by mouth every 6 (six) hours as needed.   pantoprazole (PROTONIX) 40 MG tablet Take 1 tablet (40 mg total) by mouth daily. 30 minutes before the first meal of the day   No facility-administered encounter medications on file as of 11/12/2020.    ALLERGIES:  Allergies  Allergen Reactions   Albuterol Hives and Shortness Of Breath     FAMILY HISTORY:  Family History  Problem Relation Age of Onset   Cancer Father        Unsure of type of cancer   Hypertension Father    Diabetes Father    Breast cancer Maternal Aunt  5   Stomach cancer Paternal Grandmother    Colon cancer Neg Hx    Endometrial cancer Neg Hx    Pancreatic cancer Neg Hx    Prostate cancer Neg Hx      SOCIAL HISTORY:  Social Connections: Not on file    REVIEW OF SYSTEMS:  Denies appetite changes, fevers, chills, fatigue, unexplained weight changes. Denies hearing loss, neck lumps or masses, mouth sores, ringing in ears or voice changes. Denies cough or wheezing.  Denies shortness of breath. Denies chest pain or palpitations. Denies leg swelling. Denies abdominal distention, pain, blood in stools, constipation, diarrhea, nausea, vomiting, or early satiety. Denies pain with intercourse, dysuria, frequency, hematuria or incontinence. Denies hot flashes, pelvic pain, vaginal bleeding or vaginal discharge.   Denies joint pain, back pain or muscle pain/cramps. Denies itching, rash, or wounds. Denies dizziness, headaches, numbness or seizures. Denies swollen lymph nodes or glands, denies easy bruising or bleeding. Denies anxiety, depression, confusion, or decreased concentration.  Physical Exam:  Vital Signs for this encounter:  Blood pressure  138/86, pulse 71, temperature 97.9 F (36.6 C), temperature source Tympanic, resp. rate 18, height '5\' 5"'$  (1.651 m), weight 203 lb 14.4 oz (92.5 kg), last menstrual period 05/05/2013, SpO2 100 %. Body mass index is 33.93 kg/m. General: Alert, oriented, no acute distress.  HEENT: Normocephalic, atraumatic. Sclera anicteric.  Chest: Clear to auscultation bilaterally. No wheezes, rhonchi, or rales. Cardiovascular: Regular rate and rhythm, no murmurs, rubs, or gallops.  Abdomen: obese. Normoactive bowel sounds. Soft, nondistended, nontender to palpation. No masses or hepatosplenomegaly appreciated. No palpable fluid wave.  Multiple well-healed laparoscopic incisions. Extremities: Grossly normal range of motion. Warm, well perfused. No edema bilaterally.  Skin: No rashes or lesions.  Lymphatics: No cervical, supraclavicular, or inguinal adenopathy.  GU:  External female genitalia notable for several sutures visible within the hairline approximately 2 cm superior to the clitoris in the midline.  The area itself is not erythematous, no abnormality of the skin noted.  There is approximately 3 mm of induration which is difficult to tell if this represents tumor versus inflammation from recent biopsy.  Remainder of the vulva closely inspected with no lesions identified.             Bladder/urethra:  No lesions or masses, well supported bladder             Vagina: Well rugated, no masses or lesions.             Cervix/uterus: Surgically absent.             Adnexa: No masses appreciated.  LABORATORY AND RADIOLOGIC DATA:  Outside medical records were reviewed to synthesize the above history, along with the history and physical obtained during the visit.   Lab Results  Component Value Date   WBC 10.6 02/01/2016   HGB 13.2 02/01/2016   HCT 39.2 02/01/2016   PLT 318 02/01/2016   GLUCOSE 87 02/01/2016   ALT 42 (H) 02/01/2016   AST 27 02/01/2016   NA 141 02/01/2016   K 4.5 02/01/2016   CL 102 02/01/2016    CREATININE 0.76 02/01/2016   BUN 10 02/01/2016   CO2 24 02/01/2016

## 2020-11-12 NOTE — Telephone Encounter (Signed)
[name] with PET scan appointment on 11/29/20 at 1:00 (arrival at 12:30, NPO 6 hours prior, avoid carbs).  [name] verbalized understanding of appointment and instructions.

## 2020-11-12 NOTE — Patient Instructions (Signed)
It was a pleasure to meet you today.  I am tentatively scheduling you for surgery next week.  Plan will be to remove surrounding tissue around the area already biopsied to help determine whether there is any residual cancer and to help determine if the cancer is invasive and how invasive it is.  Depending on the results from this procedure, you may require additional procedures including more removal of vulvar tissue and/or lymph node assessment.  I'm also scheduling a PET scan to look for any evidence of cancer spread.

## 2020-11-12 NOTE — H&P (View-Only) (Signed)
GYNECOLOGIC ONCOLOGY NEW PATIENT CONSULTATION   Patient Name: [name]  Patient Age: 40 y.o. 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  Reviewed with the patient her recent biopsy results as well as my exam today.  It is difficult to tell if there is still little residual nodule versus induration secondary to recent biopsy.  I do not see any other vulvar lesions.  I spoke with the pathologist who reviewed her biopsy specimen.  Given the specimen, it was difficult to tell whether there was superficial invasion.  Given this, I recommended that we proceed with wide local excision to help determine invasion and whether more radical procedure with lymph node assessment is warranted.  While I would not typically order imaging in someone with such a small, apparent early stage lesion, given her recent weight gain and new onset edema, I think that imaging is warranted to rule out any lymph node involvement.  I do not appreciate adenopathy on her exam today.  I have ordered a PET scan which will be scheduled by my office.  We discussed the plan for wide local excision, possible frozen section evaluation, possible partial radical excision, and any other indicated procedures.  We discussed the risks of surgery including but not limited to bleeding, need for blood transfusion, infection, damage to surrounding structures requiring repair, medical complications, and rarely death.  All the patient's questions were answered today.  She will return for preoperative visit with our nurse practitioner, Joylene John.  A copy of this note was sent to the patient's referring provider.   65 minutes of total time was spent for this patient encounter, including  preparation, face-to-face counseling with the patient and coordination of care, and documentation of the encounter.  Jeral Pinch, MD  Division of Gynecologic Oncology  Department of Obstetrics and Gynecology  [facility]  ___________________________________________  Chief Complaint: No chief complaint on file.   History of Present Illness:  [name] is a 40 y.o. y.o. female who is seen in consultation at the request of Dr. 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  Patient reports that for about 2 years, she has had a palpable lesion above her clitoris.  Initially, this was just a tiny bump, but it has increased steadily in size in the last 6-12 months.  She thought it look like a wart and given her embarrassment regarding its appearance, did not seek medical care.  It would intermittently become painful and irritated, and she would have spotting secondary to irritation from clothes and underwear rubbing the area.  She had some associated pruritus.  She would have occasional burning.  About a week before her recent visit with Dr. Matthew Saras, a portion of the lesion fell off while she was in the shower.  Exophytic periclitoral lesion excised on 7/18.  This showed squamous cell carcinoma.  Biopsy noted to be superficial and assessment of invasion cannot be completed.  The specimen measured 1 x 1 x 0.5 cm.  She reports good appetite without nausea or emesis.  She has gained about 14 pounds in the last 2 months.  Secondary to lower extremity edema, she was recently started on Lasix which has helped.  She has recently felt bloated and noted that her feet  are swollen.  She reports normal bowel and bladder function.  She endorses a history of seizure disorder although has not had a seizure in a number of years.  She takes Ambien for sleep issues.  She had a hysterectomy with bilateral salpingectomy for fibroids.  She notes occasional very light  vaginal spotting that she describes as bright red, typically after intercourse.  She underwent a LEEP a number of years ago for dysplasia and has had normal Pap smears since.  She lives in [place] with her husband and son.  She denies any tobacco or alcohol use.  PAST MEDICAL HISTORY:  Past Medical History:  Diagnosis Date   Dysrhythmia    followed by PCP in [place]   HTN (hypertension)    Hypothyroidism    Insomnia    Seizures ([place])    no seizure episodes in "years"     PAST SURGICAL HISTORY:  Past Surgical History:  Procedure Laterality Date   BILATERAL SALPINGECTOMY Bilateral 12/12/2013   Procedure: BILATERAL SALPINGECTOMY;  Surgeon: Margarette Asal, MD;  Location: [facility];  Service: Gynecology;  Laterality: Bilateral;   ESOPHAGOGASTRODUODENOSCOPY N/A 10/01/2015   Dr. Oneida Alar: normal esophagus, mild gastritis, duodenal diverticulum    LAPAROSCOPIC ASSISTED VAGINAL HYSTERECTOMY N/A 12/12/2013   uterine fibroids   LEEP      OB/GYN HISTORY:  OB History  Gravida Para Term Preterm AB Living  2 2          SAB IAB Ectopic Multiple Live Births               # Outcome Date GA Lbr Len/2nd Weight Sex Delivery Anes PTL Lv  2 Para           1 Para             Patient's last menstrual period was 05/05/2013.  Age at menarche: 35 Age at menopause: Menses stopped with her hysterectomy, is not having menopausal symptoms Hx of HRT: Denies Hx of STDs: Denies Last pap: 2022.  Patient has a remote history of an abnormal Pap approximately 19 years ago.  She had a LEEP at that time for dysplasia and reports normal Paps since History of abnormal pap smears: Yes, see above  SCREENING STUDIES:  Last mammogram: 11/2019  Last colonoscopy: n/a  MEDICATIONS: Outpatient Encounter Medications as of 11/12/2020  Medication Sig   atenolol (TENORMIN) 50 MG tablet    furosemide (LASIX) 20 MG tablet Take 20 mg by mouth.   LamoTRIgine 100 MG TB24 24 hour tablet    OVER THE COUNTER MEDICATION  Take by mouth daily. Turmeric   zolpidem (AMBIEN) 10 MG tablet Take 10 mg by mouth at bedtime as needed for sleep.   cholecalciferol (VITAMIN D3) 25 MCG (1000 UT) tablet Take 1,000 Units by mouth daily.   ibuprofen (ADVIL,MOTRIN) 600 MG tablet Take 1 tablet (600 mg total) by mouth every 6 (six) hours as needed.   pantoprazole (PROTONIX) 40 MG tablet Take 1 tablet (40 mg total) by mouth daily. 30 minutes before the first meal of the day   No facility-administered encounter medications on file as of 11/12/2020.    ALLERGIES:  Allergies  Allergen Reactions   Albuterol Hives and Shortness Of Breath     FAMILY HISTORY:  Family History  Problem Relation Age of Onset   Cancer Father        Unsure of type of cancer   Hypertension Father    Diabetes Father    Breast cancer Maternal Aunt  73   Stomach cancer Paternal Grandmother    Colon cancer Neg Hx    Endometrial cancer Neg Hx    Pancreatic cancer Neg Hx    Prostate cancer Neg Hx      SOCIAL HISTORY:  Social Connections: Not on file    REVIEW OF SYSTEMS:  Denies appetite changes, fevers, chills, fatigue, unexplained weight changes. Denies hearing loss, neck lumps or masses, mouth sores, ringing in ears or voice changes. Denies cough or wheezing.  Denies shortness of breath. Denies chest pain or palpitations. Denies leg swelling. Denies abdominal distention, pain, blood in stools, constipation, diarrhea, nausea, vomiting, or early satiety. Denies pain with intercourse, dysuria, frequency, hematuria or incontinence. Denies hot flashes, pelvic pain, vaginal bleeding or vaginal discharge.   Denies joint pain, back pain or muscle pain/cramps. Denies itching, rash, or wounds. Denies dizziness, headaches, numbness or seizures. Denies swollen lymph nodes or glands, denies easy bruising or bleeding. Denies anxiety, depression, confusion, or decreased concentration.  Physical Exam:  Vital Signs for this encounter:  Blood pressure  138/86, pulse 71, temperature 97.9 F (36.6 C), temperature source Tympanic, resp. rate 18, height '5\' 5"'$  (1.651 m), weight 203 lb 14.4 oz (92.5 kg), last menstrual period 05/05/2013, SpO2 100 %. Body mass index is 33.93 kg/m. General: Alert, oriented, no acute distress.  HEENT: Normocephalic, atraumatic. Sclera anicteric.  Chest: Clear to auscultation bilaterally. No wheezes, rhonchi, or rales. Cardiovascular: Regular rate and rhythm, no murmurs, rubs, or gallops.  Abdomen: obese. Normoactive bowel sounds. Soft, nondistended, nontender to palpation. No masses or hepatosplenomegaly appreciated. No palpable fluid wave.  Multiple well-healed laparoscopic incisions. Extremities: Grossly normal range of motion. Warm, well perfused. No edema bilaterally.  Skin: No rashes or lesions.  Lymphatics: No cervical, supraclavicular, or inguinal adenopathy.  GU:  External female genitalia notable for several sutures visible within the hairline approximately 2 cm superior to the clitoris in the midline.  The area itself is not erythematous, no abnormality of the skin noted.  There is approximately 3 mm of induration which is difficult to tell if this represents tumor versus inflammation from recent biopsy.  Remainder of the vulva closely inspected with no lesions identified.             Bladder/urethra:  No lesions or masses, well supported bladder             Vagina: Well rugated, no masses or lesions.             Cervix/uterus: Surgically absent.             Adnexa: No masses appreciated.  LABORATORY AND RADIOLOGIC DATA:  Outside medical records were reviewed to synthesize the above history, along with the history and physical obtained during the visit.   Lab Results  Component Value Date   WBC 10.6 02/01/2016   HGB 13.2 02/01/2016   HCT 39.2 02/01/2016   PLT 318 02/01/2016   GLUCOSE 87 02/01/2016   ALT 42 (H) 02/01/2016   AST 27 02/01/2016   NA 141 02/01/2016   K 4.5 02/01/2016   CL 102 02/01/2016    CREATININE 0.76 02/01/2016   BUN 10 02/01/2016   CO2 24 02/01/2016

## 2020-11-14 NOTE — Patient Instructions (Signed)
DUE TO COVID-19 ONLY ONE VISITOR IS ALLOWED TO COME WITH YOU AND STAY IN THE WAITING ROOM ONLY DURING PRE OP AND PROCEDURE DAY OF SURGERY.   TWO VISITOR  MAY VISIT WITH YOU AFTER SURGERY IN YOUR PRIVATE ROOM DURING VISITING HOURS ONLY!  YOU NEED TO HAVE A COVID 19 TEST On__not needed____     Your procedure is scheduled on: 11-22-20   Report to [facility] Main  Entrance   Report to admitting at      1130 AM     Call this number if you have problems the morning of surgery [phone]    Remember: Do not eat food :After Midnight. You may have clear liquids until 1030 am then nothing by mouth    CLEAR LIQUID DIET   Foods Allowed                                                                     Foods Excluded water Black Coffee and tea, regular and decaf                             liquids that you cannot  Plain Jell-O any favor except red or purple                                           see through such as: Fruit ices (not with fruit pulp)                                                 milk, soups, orange juice  Iced Popsicles                                                   All solid food Carbonated beverages, regular and diet                                    Cranberry, grape and apple juices Sports drinks like Gatorade Lightly seasoned clear broth or consume(fat free) Sugar, honey syrup   _____________________________________________________________________    BRUSH YOUR TEETH MORNING OF SURGERY AND RINSE YOUR MOUTH OUT, NO CHEWING GUM CANDY OR MINTS.     Take these medicines the morning of surgery with A SIP OF WATER: atenolol, lamotrigine                                 You may not have any metal on your body including hair pins and              piercings  Do not wear jewelry, make-up, lotions, powders or perfumes, deodorant             Do  not wear nail polish on your fingernails or toenails .  Do not shave  48 hours prior to surgery.                Do not bring valuables to the hospital. [place].  Contacts, dentures or bridgework may not be worn into surgery.       Patients discharged the day of surgery will not be allowed to drive home. IF YOU ARE HAVING SURGERY AND GOING HOME THE SAME DAY, YOU MUST HAVE AN ADULT TO DRIVE YOU HOME AND BE WITH YOU FOR 24 HOURS. YOU MAY GO HOME BY TAXI OR UBER OR ORTHERWISE, BUT AN ADULT MUST ACCOMPANY YOU HOME AND STAY WITH YOU FOR 24 HOURS.  Name and phone number of your driver:  Special Instructions: N/A              Please read over the following fact sheets you were given: _____________________________________________________________________             [facility] - Preparing for Surgery Before surgery, you can play an important role.  Because skin is not sterile, your skin needs to be as free of germs as possible.  You can reduce the number of germs on your skin by washing with CHG (chlorahexidine gluconate) soap before surgery.  CHG is an antiseptic cleaner which kills germs and bonds with the skin to continue killing germs even after washing. Please DO NOT use if you have an allergy to CHG or antibacterial soaps.  If your skin becomes reddened/irritated stop using the CHG and inform your nurse when you arrive at Short Stay. Do not shave (including legs and underarms) for at least 48 hours prior to the first CHG shower.  You may shave your face/neck. Please follow these instructions carefully:  1.  Shower with CHG Soap the night before surgery and the  morning of Surgery.  2.  If you choose to wash your hair, wash your hair first as usual with your  normal  shampoo.  3.  After you shampoo, rinse your hair and body thoroughly to remove the  shampoo.                           4.  Use CHG as you would any other liquid soap.  You can apply chg directly  to the skin and wash                       Gently with a scrungie or clean washcloth.  5.   Apply the CHG Soap to your body ONLY FROM THE NECK DOWN.   Do not use on face/ open                           Wound or open sores. Avoid contact with eyes, ears mouth and genitals (private parts).                       Wash face,  Genitals (private parts) with your normal soap.             6.  Wash thoroughly, paying special attention to the area where your surgery  will be performed.  7.  Thoroughly rinse your body with warm water from the neck down.  8.  DO NOT shower/wash with your normal soap after using and rinsing off  the CHG Soap.                9.  Pat yourself dry with a clean towel.            10.  Wear clean pajamas.            11.  Place clean sheets on your bed the night of your first shower and do not  sleep with pets. Day of Surgery : Do not apply any lotions/deodorants the morning of surgery.  Please wear clean clothes to the hospital/surgery center.  FAILURE TO FOLLOW THESE INSTRUCTIONS MAY RESULT IN THE CANCELLATION OF YOUR SURGERY PATIENT SIGNATURE_________________________________  NURSE SIGNATURE__________________________________  ________________________________________________________________________

## 2020-11-14 NOTE — Progress Notes (Signed)
Please place orders in epic pt. Is scheduled for a preop  

## 2020-11-14 NOTE — Progress Notes (Addendum)
PCP - [place] Cardiologist - no  PPM/ICD -  Device Orders -  Rep Notified -   Chest x-ray -  EKG - 11-19-20 Stress Test -  ECHO -  Cardiac Cath -   Sleep Study -  CPAP -   Fasting Blood Sugar -  Checks Blood Sugar _____ times a day  Blood Thinner Instructions: Aspirin Instructions:  ERAS Protcol - PRE-SURGERY Ensure or G2-   COVID TEST- Ambulatory  Covid + 10-10-20  Activity-- Walks 3-4 miles a day without SOB Anesthesia review:   Patient denies shortness of breath, fever, cough and chest pain at PAT appointment   All instructions explained to the patient, with a verbal understanding of the material. Patient agrees to go over the instructions while at home for a better understanding. Patient also instructed to self quarantine after being tested for COVID-19. The opportunity to ask questions was provided.

## 2020-11-19 ENCOUNTER — Encounter (HOSPITAL_BASED_OUTPATIENT_CLINIC_OR_DEPARTMENT_OTHER): Payer: Self-pay | Admitting: Gynecologic Oncology

## 2020-11-19 ENCOUNTER — Other Ambulatory Visit: Payer: Self-pay

## 2020-11-19 ENCOUNTER — Encounter (HOSPITAL_COMMUNITY): Payer: Self-pay

## 2020-11-19 ENCOUNTER — Encounter (HOSPITAL_COMMUNITY)
Admission: RE | Admit: 2020-11-19 | Discharge: 2020-11-19 | Disposition: A | Discharge status: Home / Self Care | Payer: 59 | Source: Ambulatory Visit | Attending: Gynecologic Oncology | Admitting: Gynecologic Oncology

## 2020-11-19 ENCOUNTER — Ambulatory Visit (HOSPITAL_COMMUNITY)
Admission: RE | Admit: 2020-11-19 | Discharge: 2020-11-19 | Disposition: A | Discharge status: Home / Self Care | Payer: 59 | Source: Ambulatory Visit | Attending: Gynecologic Oncology | Admitting: Gynecologic Oncology

## 2020-11-19 DIAGNOSIS — C519 Malignant neoplasm of vulva, unspecified: Secondary | ICD-10-CM | POA: Diagnosis not present

## 2020-11-19 HISTORY — DX: Gastro-esophageal reflux disease without esophagitis: K21.9

## 2020-11-19 HISTORY — DX: Other adrenocortical insufficiency: E27.49

## 2020-11-19 HISTORY — DX: Pneumonia, unspecified organism: J18.9

## 2020-11-19 HISTORY — DX: Malignant (primary) neoplasm, unspecified: C80.1

## 2020-11-19 HISTORY — DX: Headache, unspecified: R51.9

## 2020-11-19 HISTORY — DX: Personal history of urinary calculi: Z87.442

## 2020-11-19 LAB — URINALYSIS, ROUTINE W REFLEX MICROSCOPIC
Bilirubin Urine: NEGATIVE
Glucose, UA: NEGATIVE mg/dL
Ketones, ur: NEGATIVE mg/dL
Leukocytes,Ua: NEGATIVE
Nitrite: NEGATIVE
Protein, ur: NEGATIVE mg/dL
Specific Gravity, Urine: 1.002 — ABNORMAL LOW (ref 1.005–1.030)
pH: 7 (ref 5.0–8.0)

## 2020-11-19 LAB — CBC
HCT: 40.5 % (ref 36.0–46.0)
Hemoglobin: 13.7 g/dL (ref 12.0–15.0)
MCH: 30.4 pg (ref 26.0–34.0)
MCHC: 33.8 g/dL (ref 30.0–36.0)
MCV: 90 fL (ref 80.0–100.0)
Platelets: 274 10*3/uL (ref 150–400)
RBC: 4.5 MIL/uL (ref 3.87–5.11)
RDW: 11.9 % (ref 11.5–15.5)
WBC: 6.8 10*3/uL (ref 4.0–10.5)
nRBC: 0 % (ref 0.0–0.2)

## 2020-11-19 LAB — BASIC METABOLIC PANEL
Anion gap: 5 (ref 5–15)
BUN: 7 mg/dL (ref 6–20)
CO2: 26 mmol/L (ref 22–32)
Calcium: 9.2 mg/dL (ref 8.9–10.3)
Chloride: 106 mmol/L (ref 98–111)
Creatinine, Ser: 0.6 mg/dL (ref 0.44–1.00)
GFR, Estimated: >60 mL/min (ref >60)
Glucose, Bld: 88 mg/dL (ref 70–99)
Potassium: 3.9 mmol/L (ref 3.5–5.1)
Sodium: 137 mmol/L (ref 135–145)

## 2020-11-19 LAB — GLUCOSE, CAPILLARY: Glucose-Capillary: 104 mg/dL — ABNORMAL HIGH (ref 70–99)

## 2020-11-19 LAB — HIV ANTIBODY (ROUTINE TESTING W REFLEX): HIV Screen 4th Generation wRfx: NONREACTIVE

## 2020-11-19 MED ORDER — FLUDEOXYGLUCOSE F - 18 (FDG) INJECTION
10.0000 | Freq: Once | INTRAVENOUS | Status: AC | PRN
  Administered 2020-11-19: 10.06 via INTRAVENOUS

## 2020-11-19 NOTE — Patient Instructions (Addendum)
We recommend purchasing several bags of frozen green peas and dividing them into ziploc bags. You will want to keep these in the freezer and have them ready to use as ice packs to the vulvar incision. Once the ice pack is no longer cold, you can get another from the freezer. The frozen peas mold to your body better than a regular ice pack.   Preparing for your Surgery  Plan for surgery on November 22, 2020 with Dr. Jeral Pinch at [facility]. You will be scheduled for a wide local excision of the vulva, possible frozen section evaluation, possible partial radical vulvectomy, and any other indicated procedures.   Pre-operative Testing -You will receive a phone call from presurgical testing at [facility] to discuss surgery instructions and arrange for lab work if needed.  -Bring your insurance card, copy of an advanced directive if applicable, medication list.  -You should not be taking blood thinners or aspirin at least ten days prior to surgery unless instructed by your surgeon.  -Do not take supplements such as fish oil (omega 3), red yeast rice, turmeric before your surgery. You want to avoid medications with aspirin in them including headache powders such as BC or Goody's), Excedrin migraine.  Day Before Surgery at [place] will be advised you can have clear liquids up until 3 hours before your surgery.    Your role in recovery Your role is to become active as soon as directed by your doctor, while still giving yourself time to heal.  Rest when you feel tired. You will be asked to do the following in order to speed your recovery:  - Cough and breathe deeply. This helps to clear and expand your lungs and can prevent pneumonia after surgery.  - [place]. Do mild physical activity. Walking or moving your legs help your circulation and body functions return to normal. Do not try to get up or walk alone the first time after  surgery.   -If you develop swelling on one leg or the other, pain in the back of your leg, redness/warmth in one of your legs, please call the office or go to the Emergency Room to have a doppler to rule out a blood clot. For shortness of breath, chest pain-seek care in the Emergency Room as soon as possible. - Actively manage your pain. Managing your pain lets you move in comfort. We will ask you to rate your pain on a scale of zero to 10. It is your responsibility to tell your doctor or nurse where and how much you hurt so your pain can be treated.  Special Considerations -Your final pathology results from surgery should be available around one week after surgery and the results will be relayed to you when available.  -FMLA forms can be faxed to [phone] and please allow 5-7 business days for completion.  Pain Management After Surgery -You have been prescribed your pain medication and bowel regimen medications before surgery so that you can have these available when you are discharged from the hospital. The pain medication is for use ONLY AFTER surgery and a new prescription will not be given.   -Make sure that you have Tylenol and Ibuprofen at home to use on a regular basis after surgery for pain control. We recommend alternating the medications every hour to six hours since they work differently and are processed in the body differently for pain relief.  -Review the attached handout  on narcotic use and their risks and side effects.   Bowel Regimen -You have been prescribed Sennakot-S to take nightly to prevent constipation especially if you are taking the narcotic pain medication intermittently.  It is important to prevent constipation and drink adequate amounts of liquids. You can stop taking this medication when you are not taking pain medication and you are back on your normal bowel routine.  Risks of Surgery Risks of surgery are low but include bleeding, infection, damage to  surrounding structures, re-operation, blood clots, and very rarely death.  AFTER SURGERY INSTRUCTIONS  Return to work:  4 weeks if applicable  Activity: 1. Be up and out of the bed during the day.  Take a nap if needed.  You may walk up steps but be careful and use the hand rail.  Stair climbing will tire you more than you think, you may need to stop part way and rest.   2. No lifting or straining for 2 weeks over 10 pounds. No pushing, pulling, straining for 2 weeks.  3. No driving for minimum 24 hours after the procedure after surgery.  Do not drive if you are taking narcotic pain medicine and make sure that your reaction time has returned.   4. You can shower as soon as the next day after surgery. Shower daily. No tub baths or submerging your body in water until cleared by your surgeon. If you have the soap that was given to you by pre-surgical testing that was used before surgery, you do not need to use it afterwards because this can irritate your incisions.   5. No sexual activity and nothing in the vagina for 4 weeks.  8. You may experience vaginal spotting and discharge after surgery.  The spotting is normal but if you experience heavy bleeding, call our office.  9. Take Tylenol or ibuprofen first for pain and only use narcotic pain medication for severe pain not relieved by the Tylenol or Ibuprofen.  Monitor your Tylenol intake to a max of 4,000 mg in a 24 hour period. You can alternate these medications after surgery.  Diet: 1. Low sodium Heart Healthy Diet is recommended but you are cleared to resume your normal (before surgery) diet after your procedure.  2. It is safe to use a laxative, such as Miralax or Colace, if you have difficulty moving your bowels. You have been prescribed Sennakot at bedtime every evening to keep bowel movements regular and to prevent constipation.    Wound Care: 1. Keep clean and dry.  Shower daily.  Reasons to call the Doctor: Fever - Oral  temperature greater than 100.4 degrees Fahrenheit Foul-smelling vaginal discharge Difficulty urinating Nausea and vomiting Increased pain at the site of the incision that is unrelieved with pain medicine. Difficulty breathing with or without chest pain New calf pain especially if only on one side Sudden, continuing increased vaginal bleeding with or without clots.   Contacts: For questions or concerns you should contact:  Dr. Jeral Pinch at [phone]  Joylene John, NP at [phone]  After Hours: call [phone] and have the GYN Oncologist paged/contacted (after 5 pm or on the weekends).  Messages sent via mychart are for non-urgent matters and are not responded to after hours so for urgent needs, please call the after hours number.

## 2020-11-19 NOTE — Progress Notes (Signed)
Spoke with patient by phone and patient aware surgery location changed to [facility] and arrive 1200 pm 11-22-2020 and follow all other instructions given at pre op 81-04-2020. Labs done at pre op 11-19-2020 cbc t & s bmp, ua hiv capillary glucose.

## 2020-11-20 ENCOUNTER — Inpatient Hospital Stay: Payer: PRIVATE HEALTH INSURANCE | Attending: Gynecologic Oncology | Admitting: Gynecologic Oncology

## 2020-11-20 VITALS — BP 129/74 | HR 77 | Temp 98.2°F | Resp 18 | Ht 65.0 in | Wt 207.3 lb

## 2020-11-20 DIAGNOSIS — C519 Malignant neoplasm of vulva, unspecified: Secondary | ICD-10-CM

## 2020-11-20 MED ORDER — IBUPROFEN 800 MG PO TABS: 800.0000 mg | ORAL_TABLET | Freq: Three times a day (TID) | ORAL | 0 refills | Status: DC | PRN

## 2020-11-20 MED ORDER — TRAMADOL HCL 50 MG PO TABS: 50.0000 mg | ORAL_TABLET | Freq: Four times a day (QID) | ORAL | 0 refills | Status: DC | PRN

## 2020-11-20 MED ORDER — SENNOSIDES-DOCUSATE SODIUM 8.6-50 MG PO TABS: 2.0000 | ORAL_TABLET | Freq: Every day | ORAL | 0 refills | Status: DC

## 2020-11-20 NOTE — Progress Notes (Signed)
Patient here with her husband for a pre-operative appointment prior to her scheduled surgery on November 22, 2020. She is scheduled for wide local excision of the vulva, possible frozen section evaluation, possible partial radical vulvectomy, and any other indicated procedures. The surgery was discussed in detail.  See after visit summary for additional details. Visual aids used to discuss items related to surgery including sequential compression stockings, IV pump, multi-modal pain regimen including tylenol.   Discussed post-op pain management in detail including the aspects of the enhanced recovery pathway.  Advised her that a new prescription would be sent in for tramadol and it is only to be used for after her upcoming surgery.  We discussed the use of tylenol post-op and to monitor for a maximum of 4,000 mg in a 24 hour period.  Also prescribed sennakot to be used after surgery and to hold if having loose stools.  Discussed bowel regimen in detail.     Discussed the use of SCDs and measures to take at home to prevent DVT including frequent mobility.  Reportable signs and symptoms of DVT discussed. Post-operative instructions discussed and expectations for after surgery. Incisional care discussed as well including reportable signs and symptoms including erythema, drainage, wound separation. Peri-bottle given.    10 minutes spent with the patient.  Verbalizing understanding of material discussed. No needs or concerns voiced at the end of the visit.   Advised patient and family to call for any needs.  Advised that her post-operative medications had been prescribed and could be picked up at any time.    This appointment is included in the global surgical bundle as pre-operative teaching and has no charge.

## 2020-11-21 ENCOUNTER — Telehealth: Payer: Self-pay

## 2020-11-21 NOTE — Telephone Encounter (Signed)
Telephone call to check on pre-operative status.  Patient compliant with pre-operative instructions.  Reinforced NPO after midnight.  No questions or concerns voiced.  Instructed to call for any needs.   

## 2020-11-21 NOTE — Anesthesia Preprocedure Evaluation (Addendum)
Anesthesia Evaluation  Patient identified by MRN, date of birth, ID band Patient awake    Reviewed: Allergy & Precautions, NPO status , Patient's Chart, lab work & pertinent test results  Airway Mallampati: II  TM Distance: >3 FB Neck ROM: Full    Dental no notable dental hx. (+) Teeth Intact, Dental Advisory Given, Implants,    Pulmonary former smoker,    Pulmonary exam normal breath sounds clear to auscultation       Cardiovascular hypertension, Pt. on home beta blockers and Pt. on medications Normal cardiovascular exam Rhythm:Regular Rate:Normal     Neuro/Psych  Headaches, Seizures -,     GI/Hepatic   Endo/Other  Hypothyroidism   Renal/GU   Female GU complaint (Vulvar ca)     Musculoskeletal   Abdominal (+) + obese (BMI 34.5),   Peds  Hematology   Anesthesia Other Findings   Reproductive/Obstetrics negative OB ROS                            Anesthesia Physical Anesthesia Plan  ASA: 2  Anesthesia Plan: General   Post-op Pain Management:    Induction: Intravenous  PONV Risk Score and Plan: 4 or greater and Treatment may vary due to age or medical condition, Ondansetron, Dexamethasone and Midazolam  Airway Management Planned: LMA  Additional Equipment: None  Intra-op Plan:   Post-operative Plan:   Informed Consent: I have reviewed the patients History and Physical, chart, labs and discussed the procedure including the risks, benefits and alternatives for the proposed anesthesia with the patient or authorized representative who has indicated his/her understanding and acceptance.     Dental advisory given  Plan Discussed with: CRNA and Anesthesiologist  Anesthesia Plan Comments: (LMA GA)       Anesthesia Quick Evaluation

## 2020-11-22 ENCOUNTER — Encounter (HOSPITAL_BASED_OUTPATIENT_CLINIC_OR_DEPARTMENT_OTHER)
Admission: RE | Disposition: A | Discharge status: Home / Self Care | Payer: Self-pay | Source: Home / Self Care | Attending: Gynecologic Oncology

## 2020-11-22 ENCOUNTER — Encounter (HOSPITAL_BASED_OUTPATIENT_CLINIC_OR_DEPARTMENT_OTHER): Payer: Self-pay | Admitting: Gynecologic Oncology

## 2020-11-22 ENCOUNTER — Ambulatory Visit (HOSPITAL_BASED_OUTPATIENT_CLINIC_OR_DEPARTMENT_OTHER)
Admission: RE | Admit: 2020-11-22 | Discharge: 2020-11-22 | Disposition: A | Discharge status: Home / Self Care | Payer: 59 | Attending: Gynecologic Oncology | Admitting: Gynecologic Oncology

## 2020-11-22 ENCOUNTER — Ambulatory Visit (HOSPITAL_BASED_OUTPATIENT_CLINIC_OR_DEPARTMENT_OTHER): Payer: 59 | Admitting: Anesthesiology

## 2020-11-22 DIAGNOSIS — C519 Malignant neoplasm of vulva, unspecified: Secondary | ICD-10-CM | POA: Insufficient documentation

## 2020-11-22 DIAGNOSIS — Z79899 Other long term (current) drug therapy: Secondary | ICD-10-CM | POA: Diagnosis not present

## 2020-11-22 DIAGNOSIS — Z888 Allergy status to other drugs, medicaments and biological substances status: Secondary | ICD-10-CM | POA: Diagnosis not present

## 2020-11-22 DIAGNOSIS — Z01818 Encounter for other preprocedural examination: Secondary | ICD-10-CM

## 2020-11-22 DIAGNOSIS — D071 Carcinoma in situ of vulva: Secondary | ICD-10-CM | POA: Diagnosis present

## 2020-11-22 HISTORY — PX: VULVECTOMY: SHX1086

## 2020-11-22 LAB — TYPE AND SCREEN
ABO/RH(D): A POS
Antibody Screen: NEGATIVE

## 2020-11-22 LAB — ABO/RH: ABO/RH(D): A POS

## 2020-11-22 LAB — POCT PREGNANCY, URINE: Preg Test, Ur: NEGATIVE

## 2020-11-22 SURGERY — WIDE EXCISION VULVECTOMY
Anesthesia: General | Site: Vulva

## 2020-11-22 MED ORDER — ONDANSETRON HCL 4 MG/2ML IJ SOLN: 4.0000 mg | Freq: Once | INTRAMUSCULAR | Status: DC | PRN

## 2020-11-22 MED ORDER — HYDROMORPHONE HCL 1 MG/ML IJ SOLN
0.2500 mg | INTRAMUSCULAR | Status: DC | PRN
  Administered 2020-11-22 (×2): 0.25 mg via INTRAVENOUS

## 2020-11-22 MED ORDER — ACETAMINOPHEN 500 MG PO TABS
ORAL_TABLET | ORAL | Status: AC
  Filled 2020-11-22: qty 2

## 2020-11-22 MED ORDER — ONDANSETRON HCL 4 MG/2ML IJ SOLN
INTRAMUSCULAR | Status: AC
  Filled 2020-11-22: qty 2

## 2020-11-22 MED ORDER — CEFAZOLIN SODIUM-DEXTROSE 2-4 GM/100ML-% IV SOLN
2.0000 g | INTRAVENOUS | Status: AC
  Administered 2020-11-22: 2 g via INTRAVENOUS

## 2020-11-22 MED ORDER — LIDOCAINE HCL (PF) 2 % IJ SOLN
INTRAMUSCULAR | Status: AC
  Filled 2020-11-22: qty 5

## 2020-11-22 MED ORDER — DEXAMETHASONE SODIUM PHOSPHATE 10 MG/ML IJ SOLN
INTRAMUSCULAR | Status: AC
  Filled 2020-11-22: qty 1

## 2020-11-22 MED ORDER — ONDANSETRON HCL 4 MG/2ML IJ SOLN
INTRAMUSCULAR | Status: DC | PRN
Start: 2020-11-22 — End: 2020-11-22
  Administered 2020-11-22: 4 mg via INTRAVENOUS

## 2020-11-22 MED ORDER — MIDAZOLAM HCL 5 MG/5ML IJ SOLN
INTRAMUSCULAR | Status: DC | PRN
  Administered 2020-11-22: 2 mg via INTRAVENOUS

## 2020-11-22 MED ORDER — BUPIVACAINE HCL 0.25 % IJ SOLN
INTRAMUSCULAR | Status: DC | PRN
  Administered 2020-11-22: 10 mL

## 2020-11-22 MED ORDER — DEXMEDETOMIDINE (PRECEDEX) IN NS 20 MCG/5ML (4 MCG/ML) IV SYRINGE
PREFILLED_SYRINGE | INTRAVENOUS | Status: DC | PRN
  Administered 2020-11-22: 8 ug via INTRAVENOUS

## 2020-11-22 MED ORDER — DEXAMETHASONE SODIUM PHOSPHATE 10 MG/ML IJ SOLN
4.0000 mg | INTRAMUSCULAR | Status: AC
  Administered 2020-11-22: 4 mg via INTRAVENOUS

## 2020-11-22 MED ORDER — CELECOXIB 200 MG PO CAPS
ORAL_CAPSULE | ORAL | Status: AC
  Filled 2020-11-22: qty 1

## 2020-11-22 MED ORDER — DEXMEDETOMIDINE (PRECEDEX) IN NS 20 MCG/5ML (4 MCG/ML) IV SYRINGE
PREFILLED_SYRINGE | INTRAVENOUS | Status: AC
  Filled 2020-11-22: qty 5

## 2020-11-22 MED ORDER — ACETAMINOPHEN 10 MG/ML IV SOLN: 1000.0000 mg | Freq: Once | INTRAVENOUS | Status: DC | PRN

## 2020-11-22 MED ORDER — HEPARIN SODIUM (PORCINE) 5000 UNIT/ML IJ SOLN
INTRAMUSCULAR | Status: AC
  Filled 2020-11-22: qty 1

## 2020-11-22 MED ORDER — CELECOXIB 200 MG PO CAPS
200.0000 mg | ORAL_CAPSULE | ORAL | Status: AC
  Administered 2020-11-22: 200 mg via ORAL

## 2020-11-22 MED ORDER — FENTANYL CITRATE (PF) 100 MCG/2ML IJ SOLN
INTRAMUSCULAR | Status: AC
  Filled 2020-11-22: qty 2

## 2020-11-22 MED ORDER — CEFAZOLIN SODIUM-DEXTROSE 2-4 GM/100ML-% IV SOLN
INTRAVENOUS | Status: AC
  Filled 2020-11-22: qty 100

## 2020-11-22 MED ORDER — AMISULPRIDE (ANTIEMETIC) 5 MG/2ML IV SOLN: 10.0000 mg | Freq: Once | INTRAVENOUS | Status: DC | PRN

## 2020-11-22 MED ORDER — FENTANYL CITRATE (PF) 100 MCG/2ML IJ SOLN
INTRAMUSCULAR | Status: DC | PRN
  Administered 2020-11-22 (×2): 25 ug via INTRAVENOUS
  Administered 2020-11-22: 50 ug via INTRAVENOUS

## 2020-11-22 MED ORDER — HYDROMORPHONE HCL 1 MG/ML IJ SOLN
INTRAMUSCULAR | Status: AC
  Filled 2020-11-22: qty 1

## 2020-11-22 MED ORDER — MIDAZOLAM HCL 2 MG/2ML IJ SOLN
INTRAMUSCULAR | Status: AC
  Filled 2020-11-22: qty 2

## 2020-11-22 MED ORDER — LIDOCAINE HCL (CARDIAC) PF 100 MG/5ML IV SOSY
PREFILLED_SYRINGE | INTRAVENOUS | Status: DC | PRN
  Administered 2020-11-22: 100 mg via INTRAVENOUS

## 2020-11-22 MED ORDER — DEXAMETHASONE SODIUM PHOSPHATE 4 MG/ML IJ SOLN: INTRAMUSCULAR | Status: DC | PRN

## 2020-11-22 MED ORDER — OXYCODONE HCL 5 MG/5ML PO SOLN: 5.0000 mg | Freq: Once | ORAL | Status: DC | PRN

## 2020-11-22 MED ORDER — 0.9 % SODIUM CHLORIDE (POUR BTL) OPTIME
TOPICAL | Status: DC | PRN
  Administered 2020-11-22: 500 mL

## 2020-11-22 MED ORDER — CHLORHEXIDINE GLUCONATE 0.12 % MT SOLN: 15.0000 mL | Freq: Once | OROMUCOSAL | Status: DC

## 2020-11-22 MED ORDER — ORAL CARE MOUTH RINSE: 15.0000 mL | Freq: Once | OROMUCOSAL | Status: DC

## 2020-11-22 MED ORDER — LACTATED RINGERS IV SOLN: INTRAVENOUS | Status: DC

## 2020-11-22 MED ORDER — PROPOFOL 10 MG/ML IV BOLUS
INTRAVENOUS | Status: AC
  Filled 2020-11-22: qty 20

## 2020-11-22 MED ORDER — HEPARIN SODIUM (PORCINE) 5000 UNIT/ML IJ SOLN
5000.0000 [IU] | INTRAMUSCULAR | Status: AC
  Administered 2020-11-22: 5000 [IU] via SUBCUTANEOUS

## 2020-11-22 MED ORDER — OXYCODONE HCL 5 MG PO TABS: 5.0000 mg | ORAL_TABLET | Freq: Once | ORAL | Status: DC | PRN

## 2020-11-22 MED ORDER — ACETAMINOPHEN 500 MG PO TABS
1000.0000 mg | ORAL_TABLET | ORAL | Status: AC
  Administered 2020-11-22: 1000 mg via ORAL

## 2020-11-22 MED ORDER — PROPOFOL 10 MG/ML IV BOLUS
INTRAVENOUS | Status: DC | PRN
  Administered 2020-11-22: 120 mg via INTRAVENOUS

## 2020-11-22 SURGICAL SUPPLY — 33 items
BLADE CLIPPER SENSICLIP SURGIC (BLADE) IMPLANT
BLADE SURG 15 STRL LF DISP TIS (BLADE) ×1 IMPLANT
BLADE SURG 15 STRL SS (BLADE) ×2
CATH ROBINSON RED A/P 16FR (CATHETERS) ×1 IMPLANT
GAUZE 4X4 16PLY ~~LOC~~+RFID DBL (SPONGE) ×3 IMPLANT
GLOVE SURG ENC MOIS LTX SZ6 (GLOVE) ×4 IMPLANT
GOWN STRL REUS W/TWL LRG LVL3 (GOWN DISPOSABLE) ×2 IMPLANT
KIT TURNOVER CYSTO (KITS) ×2 IMPLANT
NDL HYPO 25X1 1.5 SAFETY (NEEDLE) ×1 IMPLANT
NEEDLE HYPO 22GX1.5 SAFETY (NEEDLE) ×1 IMPLANT
NEEDLE HYPO 25X1 1.5 SAFETY (NEEDLE) ×2 IMPLANT
NS IRRIG 500ML POUR BTL (IV SOLUTION) ×2 IMPLANT
PACK PERINEAL COLD (PAD) ×2 IMPLANT
PACK VAGINAL WOMENS (CUSTOM PROCEDURE TRAY) ×2 IMPLANT
PUNCH BIOPSY DERMAL 3 (INSTRUMENTS) IMPLANT
PUNCH BIOPSY DERMAL 3MM (INSTRUMENTS) ×2
PUNCH BIOPSY DERMAL 4MM (INSTRUMENTS) IMPLANT
SUT VIC AB 0 SH 27 (SUTURE) ×2 IMPLANT
SUT VIC AB 2-0 CT1 27 (SUTURE) ×2
SUT VIC AB 2-0 CT1 TAPERPNT 27 (SUTURE) IMPLANT
SUT VIC AB 2-0 CT2 27 (SUTURE) IMPLANT
SUT VIC AB 2-0 SH 27 (SUTURE)
SUT VIC AB 2-0 SH 27XBRD (SUTURE) IMPLANT
SUT VIC AB 3-0 SH 27 (SUTURE) ×2
SUT VIC AB 3-0 SH 27X BRD (SUTURE) ×1 IMPLANT
SUT VIC AB 4-0 PS2 18 (SUTURE) ×2 IMPLANT
SUT VIC AB 4-0 SH 27 (SUTURE) ×2
SUT VIC AB 4-0 SH 27XANBCTRL (SUTURE) IMPLANT
SUT VICRYL 4-0 PS2 18IN ABS (SUTURE) ×1 IMPLANT
SWAB OB GYN 8IN STERILE 2PK (MISCELLANEOUS) IMPLANT
SYR BULB IRRIG 60ML STRL (SYRINGE) ×2 IMPLANT
TOWEL OR 17X26 10 PK STRL BLUE (TOWEL DISPOSABLE) ×2 IMPLANT
WATER STERILE IRR 500ML POUR (IV SOLUTION) IMPLANT

## 2020-11-22 NOTE — Op Note (Signed)
PATIENT: [name] DATE: 11/22/20  Preop Diagnosis: SCC  Postoperative Diagnosis: same as above, possible subcutaneous infection on anterior right vulva  Surgery: Partial simple anterior vulvectomy, vulvar biopsies  Surgeons:  Valarie Cones MD  Assistant: none  Anesthesia: General   Estimated blood loss: 77m  IVF:  see I&O flowsheet   Urine output: n/a   Complications: None apparent  Pathology: Anterior mid vulva with marking stitch at 12 o'clock, two anterior right vulvar biopsies (superior and inferior)  Operative findings: 8x817marea, mildly erythematous and raised, stitches from prior excision there. No surrounding induration. On anterior right vulva, two superficial areas of mild skin discoloration, 32m49munch biopsies of these taken given PET findings. Some minimal purulent appearing fluid noted upon biopsy of each area.  Procedure: The patient was identified in the preoperative holding area. Informed consent was signed on the chart. Patient was seen history was reviewed and exam was performed.   The patient was then taken to the operating room and placed in the supine position with SCD hose on. General anesthesia was then induced without difficulty. She was then placed in the dorsolithotomy position. The perineum and vagina were prepped with Hibiclens. The patient was then draped after the prep was dried.   Timeout was performed the patient, procedure, antibiotic, allergy, and length of procedure. The lesion was identified and the marking pen was used to circumscribe the area with a 1cm surgical margin around the entire lesion. The subcuticular tissues were infiltrated with 1% lidocaine. The 15 blade scalpel was used to make an incision through the skin circumferentially as marked. The skin elipse was grasped and was separated from the underlying subcutaneous tissues with the scalpel. After the specimen had been completely resected, it was oriented and marked at 12  o'clock with a 0-vicryl suture. The bovie was used to obtain hemostasis at the surgical bed. The subcutaneous tissues were irrigated and made hemostatic.   The deep dermal layer was approximated with 2-0vicryl mattress sutures to bring the skin edges into approximation and off tension. The wound was closed following langher's lines. The cutaneous layer was closed with interrupted 4-0 vicryl stitches and mattress sutures to ensure a tension free and hemostatic closure. The perineum was again irrigated.   32mm62mnch biopsies were taken of the right anterior vulvar lesions. Monopolar electrocautery was used to achieve hemostasis. The lesions were irrigated. 4-0 Vicryl suture was used in mattress fashion to close the biopsy sites and assure hemostasis.  All instrument, suture, laparotomy, Ray-Tec, and needle counts were correct x2. The patient tolerated the procedure well and was taken recovery room in stable condition.   KathJeral PinchGynecologic Oncology

## 2020-11-22 NOTE — Transfer of Care (Signed)
Immediate Anesthesia Transfer of Care Note  Patient: [name]  Procedure(s) Performed: WIDE EXCISION VULVECTOMY (Vulva)  Patient Location: PACU  Anesthesia Type:General  Level of Consciousness: awake, alert  and oriented  Airway & Oxygen Therapy: Patient Spontanous Breathing and Patient connected to nasal cannula oxygen  Post-op Assessment: Report given to RN and Post -op Vital signs reviewed and stable  Post vital signs: Reviewed and stable  Last Vitals:  Vitals Value Taken Time  BP 134/85 11/22/20 1326  Temp    Pulse 69 11/22/20 1328  Resp 17 11/22/20 1328  SpO2 100 % 11/22/20 1328  Vitals shown include unvalidated device data.  Last Pain:  Vitals:   11/22/20 1225  TempSrc: Oral  PainSc: 0-No pain      Patients Stated Pain Goal: 4 ([id number])  Complications: No notable events documented.

## 2020-11-22 NOTE — Anesthesia Postprocedure Evaluation (Signed)
Anesthesia Post Note  Patient: [name]  Procedure(s) Performed: WIDE EXCISION VULVECTOMY (Vulva)     Patient location during evaluation: PACU Anesthesia Type: General Level of consciousness: awake and alert Pain management: pain level controlled Vital Signs Assessment: post-procedure vital signs reviewed and stable Respiratory status: spontaneous breathing, nonlabored ventilation, respiratory function stable and patient connected to nasal cannula oxygen Cardiovascular status: blood pressure returned to baseline and stable Postop Assessment: no apparent nausea or vomiting Anesthetic complications: no   No notable events documented.  Last Vitals:  Vitals:   11/22/20 1400 11/22/20 1442  BP: 111/66 108/64  Pulse: (!) 53 (!) 52  Resp: 12 14  Temp:  (!) 36.4 C  SpO2: 97% 99%    Last Pain:  Vitals:   11/22/20 1442  TempSrc:   PainSc: [place]

## 2020-11-22 NOTE — Discharge Instructions (Addendum)
11/22/2020  Return to work: 1-4 weeks  Activity: 1. Be up and out of the bed during the day.  Take a nap if needed.  You may walk up steps but be careful and use the hand rail.  Stair climbing will tire you more than you think, you may need to stop part way and rest.   2. No lifting or straining for 6 weeks.  3. Do Not drive if you are taking narcotic pain medicine.  4. Shower daily.  Use soap and water on your incision and pat dry; don't rub.   5. No sexual activity and nothing in the vagina for 6 weeks or until your incision has healed.  Medications:  - Take ibuprofen and tylenol first line for pain control. Take these regularly (every 6 hours) to decrease the build up of pain.  - If necessary, for severe pain not relieved by ibuprofen, take tramadol.  - While taking tramadol you should take sennakot every night to reduce the likelihood of constipation. If this causes diarrhea, stop its use.  Diet: 1. Low sodium Heart Healthy Diet is recommended.  2. It is safe to use a laxative if you have difficulty moving your bowels.   Wound Care: 1. Keep clean and dry.  Shower daily.  Reasons to call the Doctor:  Fever - Oral temperature greater than 100.4 degrees Fahrenheit Foul-smelling vaginal discharge Difficulty urinating Nausea and vomiting Increased pain at the site of the incision that is unrelieved with pain medicine. Difficulty breathing with or without chest pain New calf pain especially if only on one side   Follow-up: 1. See Jeral Pinch in 3-4 weeks. You will have a phone visit next week once pathology back.  Contacts: For questions or concerns you should contact:  Dr. Jeral Pinch at [phone] After hours and on week-ends call [phone] and ask to speak to the physician on call for Gynecologic Oncology    Post Anesthesia Home Care Instructions  Activity: Get plenty of rest for the remainder of the day. A responsible individual must stay with you  for 24 hours following the procedure.  For the next 24 hours, DO NOT: -Drive a car -Paediatric nurse -Drink alcoholic beverages -Take any medication unless instructed by your physician -Make any legal decisions or sign important papers.  Meals: Start with liquid foods such as gelatin or soup. Progress to regular foods as tolerated. Avoid greasy, spicy, heavy foods. If nausea and/or vomiting occur, drink only clear liquids until the nausea and/or vomiting subsides. Call your physician if vomiting continues.  Special Instructions/Symptoms: Your throat may feel dry or sore from the anesthesia or the breathing tube placed in your throat during surgery. If this causes discomfort, gargle with warm salt water. The discomfort should disappear within 24 hours.  If you had a scopolamine patch placed behind your ear for the management of post- operative nausea and/or vomiting:  1. The medication in the patch is effective for 72 hours, after which it should be removed.  Wrap patch in a tissue and discard in the trash. Wash hands thoroughly with soap and water. 2. You may remove the patch earlier than 72 hours if you experience unpleasant side effects which may include dry mouth, dizziness or visual disturbances. 3. Avoid touching the patch. Wash your hands with soap and water after contact with the patch.

## 2020-11-22 NOTE — Anesthesia Procedure Notes (Signed)
Procedure Name: LMA Insertion Date/Time: 11/22/2020 12:37 PM Performed by: Bufford Spikes, CRNA Pre-anesthesia Checklist: Patient identified, Emergency Drugs available, Suction available and Patient being monitored Patient Re-evaluated:Patient Re-evaluated prior to induction Oxygen Delivery Method: Circle system utilized Preoxygenation: Pre-oxygenation with 100% oxygen Induction Type: IV induction Ventilation: Mask ventilation without difficulty LMA: LMA inserted LMA Size: 4.0 Number of attempts: 1 Airway Equipment and Method: Bite block Placement Confirmation: positive ETCO2 Tube secured with: Tape Dental Injury: Teeth and Oropharynx as per pre-operative assessment

## 2020-11-22 NOTE — Interval H&P Note (Signed)
History and Physical Interval Note:  11/22/2020 11:59 AM  [name]  has presented today for surgery, with the diagnosis of VULVAR CANCER.  The various methods of treatment have been discussed with the patient and family. After consideration of risks, benefits and other options for treatment, the patient has consented to  Procedure(s): WIDE EXCISION VULVECTOMY (N/A) as a surgical intervention.  The patient's history has been reviewed, patient examined, no change in status, stable for surgery.  I have reviewed the patient's chart and labs.  Questions were answered to the patient's satisfaction.     Lafonda Mosses

## 2020-11-23 ENCOUNTER — Encounter (HOSPITAL_BASED_OUTPATIENT_CLINIC_OR_DEPARTMENT_OTHER): Payer: Self-pay | Admitting: Gynecologic Oncology

## 2020-11-23 ENCOUNTER — Telehealth: Payer: Self-pay

## 2020-11-23 NOTE — Telephone Encounter (Signed)
Left voicemail for [facility] requesting a return call.

## 2020-11-23 NOTE — Telephone Encounter (Signed)
Spoke with Ms. Dobosz this morning. She states she is eating, drinking and urinating well.She did have some burning with urination but this has subsided. She has not had a BM yet but is passing gas. She is taking senokot as prescribed and encouraged her to drink plenty of water. She denies fever or chills. Reports  discomfort from the biopsy sites not the incision. Pt. States it feels like a bee sting but frozen peas are helping.   Instructed to call office with any fever, chills, purulent drainage, uncontrolled pain or any other questions or concerns. Patient verbalizes understanding.   Pt aware of post op appointments as well as the office number [phone] and after hours number [phone] to call if she has any questions or concerns

## 2020-11-27 ENCOUNTER — Encounter: Payer: Self-pay | Admitting: Gynecologic Oncology

## 2020-11-27 ENCOUNTER — Inpatient Hospital Stay (HOSPITAL_BASED_OUTPATIENT_CLINIC_OR_DEPARTMENT_OTHER): Payer: PRIVATE HEALTH INSURANCE | Admitting: Gynecologic Oncology

## 2020-11-27 ENCOUNTER — Encounter: Payer: PRIVATE HEALTH INSURANCE | Admitting: Gynecologic Oncology

## 2020-11-27 DIAGNOSIS — C519 Malignant neoplasm of vulva, unspecified: Secondary | ICD-10-CM

## 2020-11-27 DIAGNOSIS — Z7189 Other specified counseling: Secondary | ICD-10-CM

## 2020-11-27 DIAGNOSIS — Z9889 Other specified postprocedural states: Secondary | ICD-10-CM

## 2020-11-27 NOTE — Progress Notes (Signed)
Gynecologic Oncology Telehealth Consult Note: Gyn-Onc  I connected with [name] on 11/27/20 at  1:00 PM EDT by telephone and verified that I am speaking with the correct person using two identifiers.  I discussed the limitations, risks, security and privacy concerns of performing an evaluation and management service by telemedicine and the availability of in-person appointments. I also discussed with the patient that there may be a patient responsible charge related to this service. The patient expressed understanding and agreed to proceed.  Other persons participating in the visit and their role in the encounter: None.  Patient's location: Home Provider's location: [facility]  Reason for Visit: Follow-up after surgery, pathology review  Treatment History: The patient presented with a 2-year history of a palpable lesion above her clitoris. 11/05/2020: Exophytic supra clitoral lesion excised in clinic.  Pathology from this noted a 1 x 1 x 0.5 cm lesion, biopsy noted to be superficial and assessment of invasion could not be completed. 11/19/2020: PET shows ill-defined hypermetabolic activity of the vulva.  Nodular cutaneous focus of abnormal hypermetabolic activity along the right anterior labia, nonspecific.  Hypermetabolic 1 cm right thyroid nodule, thyroid ultrasound recommended. 11/22/2020: Partial simple anterior vulvectomy, vulvar biopsies.  Findings at the time of surgery were an 8 x 8 mm area that was mildly erythematous and raised at the site of prior excision, stitch is still in place.  No surrounding induration noted.  2 superficial areas of mild skin discoloration on the right vulva were biopsied. 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  Right vulvar biopsies with no dysplasia or carcinoma, 1 showing ulcer with scale crust and the other acute inflammation with fibrosis  Interval  History: The patient reports doing well since surgery.  She is having some swelling at the site of her incision which contributes to minor pain.  This has improved with time.  She feels that cold therapy helps the most with this.  She had some drainage from the incision the first day or 2 after surgery and a little bit again yesterday.  Denies any significant drainage or bleeding.  Denies any fevers or chills.  Is tolerating a regular appetite.  Reports return of bowel and bladder function.  Past Medical/Surgical History: Past Medical History:  Diagnosis Date   Adrenal calcification (HCC)    Cancer ([place])    squamous cell carcinoma   COVID 10/10/2020   fever sore throat cough x 8-10-3 days rapid home test and work pcr done, pt does not have pcr results   Dysrhythmia    followed by PCP in [place]  years ago for palpatations and rate control   GERD (gastroesophageal reflux disease)    Headache    rare migraines   History of kidney stones    HTN (hypertension)    Hypothyroidism    Insomnia    Pneumonia    Seizures ([place])    no seizure episodes in "years"    Past Surgical History:  Procedure Laterality Date   ABDOMINAL HYSTERECTOMY     BILATERAL SALPINGECTOMY Bilateral 12/12/2013   Procedure: BILATERAL SALPINGECTOMY;  Surgeon: Margarette Asal, MD;  Location: [facility];  Service: Gynecology;  Laterality: Bilateral;   ESOPHAGOGASTRODUODENOSCOPY N/A 10/01/2015   Dr. Oneida Alar: normal esophagus, mild gastritis, duodenal diverticulum    LAPAROSCOPIC ASSISTED VAGINAL HYSTERECTOMY N/A 12/12/2013   uterine fibroids   LEEP  2004   VULVECTOMY N/A 11/22/2020   Procedure: WIDE EXCISION VULVECTOMY;  Surgeon: Lafonda Mosses, MD;  Location: [facility];  Service: Gynecology;  Laterality: N/A;    Family History  Problem Relation Age of Onset   Cancer Father        Unsure of type of cancer   Hypertension Father    Diabetes Father    Breast cancer Maternal Aunt 4   Stomach  cancer Paternal Grandmother    Colon cancer Neg Hx    Endometrial cancer Neg Hx    Pancreatic cancer Neg Hx    Prostate cancer Neg Hx     Social History   Socioeconomic History   Marital status: Married    Spouse name: Not on file   Number of children: Not on file   Years of education: Not on file   Highest education level: Not on file  Occupational History   Occupation: RN    Comment: [employer]   Tobacco Use   Smoking status: Former    Years: 10.00    Types: Cigarettes    Quit date: 2008    Years since quitting: 14.6   Smokeless tobacco: Never  Vaping Use   Vaping Use: Never used  Substance and Sexual Activity   Alcohol use: No    Alcohol/week: 0.0 standard drinks   Drug use: No   Sexual activity: Yes    Birth control/protection: Surgical  Other Topics Concern   Not on file  Social History Narrative   Not on file   Social Determinants of Health   Financial Resource Strain: Not on file  Food Insecurity: Not on file  Transportation Needs: Not on file  Physical Activity: Not on file  Stress: Not on file  Social Connections: Not on file    Current Medications:  Current Outpatient Medications:    acetaminophen (TYLENOL) 325 MG tablet, Take 650 mg by mouth every 6 (six) hours as needed for moderate pain., Disp: , Rfl:    acidophilus (RISAQUAD) CAPS capsule, Take 1 capsule by mouth daily. (Patient not taking: Reported on 11/14/2020), Disp: , Rfl:    atenolol (TENORMIN) 50 MG tablet, Take 50 mg by mouth 2 (two) times daily., Disp: , Rfl:    Cyanocobalamin (B-12 PO), Take 1 capsule by mouth daily. (Patient not taking: Reported on 11/14/2020), Disp: , Rfl:    furosemide (LASIX) 20 MG tablet, Take 20-40 mg by mouth daily., Disp: , Rfl:    ibuprofen (ADVIL) 800 MG tablet, Take 1 tablet (800 mg total) by mouth every 8 (eight) hours as needed for moderate pain. For AFTER surgery, Disp: 30 tablet, Rfl: 0   LamoTRIgine 100 MG TB24 24 hour tablet, Take 100 mg by mouth  daily., Disp: , Rfl:    senna-docusate (SENOKOT-S) 8.6-50 MG tablet, Take 2 tablets by mouth at bedtime. For AFTER surgery, do not take if having diarrhea, Disp: 30 tablet, Rfl: 0   traMADol (ULTRAM) 50 MG tablet, Take 1 tablet (50 mg total) by mouth every 6 (six) hours as needed for severe pain. For AFTER surgery, do not take and drive, Disp: 15 tablet, Rfl: 0   TURMERIC-GINGER PO, Take 1 capsule by mouth daily. (Patient not taking: Reported on 11/14/2020), Disp: , Rfl:    zolpidem (AMBIEN) 10 MG tablet, Take 10 mg by mouth at bedtime., Disp: , Rfl:   Review of Symptoms: Complete 10-system review is negative except as above in Interval History.  Physical Exam: LMP 05/05/2013  Deferred given limitations of phone visit  Laboratory & Radiologic Studies: Recent pathology  as noted above  Assessment & Plan: [name] is a 40 y.o. 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  Patient seems to be doing well from a postoperative standpoint.  She is meeting postoperative milestones.  We discussed continued activity restrictions and expectations.  I reviewed her pathology with her in detail.  There is only a small foci of squamous cell carcinoma and her resection.  This was 77m directly underneath the scar from her first excision.  I spoke with the pathologist who reviewed and we both suspect that this small foci of residual cancer is at a deeper level given healing after resection with suture placement.  There is still some uncertainty whether this was carcinoma in situ versus minimally invasive carcinoma.  Given negative margins on recent resection, negative PET, and questionable invasion, I do not think that the patient warrants surgical lymph node assessment or reexcision with a more radical procedure.  We will present her case at tumor board.  I discussed the assessment and treatment  plan with the patient. The patient was provided with an opportunity to ask questions and all were answered. The patient agreed with the plan and demonstrated an understanding of the instructions.   The patient was advised to call back or see an in-person evaluation if the symptoms worsen or if the condition fails to improve as anticipated.   22 minutes of total time was spent for this patient encounter, including preparation, face-to-face counseling with the patient and coordination of care, and documentation of the encounter.   KJeral Pinch MD  Division of Gynecologic Oncology  Department of Obstetrics and Gynecology  [facility]

## 2020-11-29 ENCOUNTER — Ambulatory Visit (HOSPITAL_COMMUNITY): Admission: RE | Admit: 2020-11-29 | Payer: PRIVATE HEALTH INSURANCE | Source: Ambulatory Visit

## 2020-11-29 ENCOUNTER — Other Ambulatory Visit: Payer: Self-pay | Admitting: Gynecologic Oncology

## 2020-11-29 DIAGNOSIS — B373 Candidiasis of vulva and vagina: Secondary | ICD-10-CM

## 2020-11-29 DIAGNOSIS — B3731 Acute candidiasis of vulva and vagina: Secondary | ICD-10-CM

## 2020-11-29 LAB — SURGICAL PATHOLOGY

## 2020-11-29 MED ORDER — FLUCONAZOLE 150 MG PO TABS: 150.0000 mg | ORAL_TABLET | Freq: Once | ORAL | 0 refills | Status: DC

## 2020-11-29 MED ORDER — FLUCONAZOLE 150 MG PO TABS: 150.0000 mg | ORAL_TABLET | Freq: Once | ORAL | 0 refills | Status: AC

## 2020-11-29 NOTE — Progress Notes (Signed)
See RN note.

## 2020-11-30 ENCOUNTER — Ambulatory Visit: Payer: PRIVATE HEALTH INSURANCE | Admitting: Gynecologic Oncology

## 2020-12-03 ENCOUNTER — Other Ambulatory Visit: Payer: Self-pay | Admitting: Gynecologic Oncology

## 2020-12-03 NOTE — Progress Notes (Signed)
Gynecologic Oncology Multi-Disciplinary Disposition Conference Note  Date of the Conference: December 03, 2020  Patient Name: [name]  Referring Provider: Dr. Ocie Cornfield Primary GYN Oncologist: Dr. 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 Disposition is to close surveillance.   This Multidisciplinary conference took place involving physicians from [place], [place], Radiation Oncology, Pathology, Radiology along with the Gynecologic Oncology Nurse Practitioner and RN.  Comprehensive assessment of the patient's malignancy, staging, need for surgery, chemotherapy, radiation therapy, and need for further testing were reviewed. Supportive measures, both inpatient and following discharge were also discussed. The recommended plan of care is documented. Greater than 35 minutes were spent correlating and coordinating this patient's care.

## 2020-12-04 ENCOUNTER — Other Ambulatory Visit (HOSPITAL_COMMUNITY): Payer: Self-pay | Admitting: Family Medicine

## 2020-12-04 ENCOUNTER — Other Ambulatory Visit: Payer: Self-pay | Admitting: Family Medicine

## 2020-12-04 DIAGNOSIS — E041 Nontoxic single thyroid nodule: Secondary | ICD-10-CM

## 2020-12-07 ENCOUNTER — Ambulatory Visit (HOSPITAL_COMMUNITY)
Admission: RE | Admit: 2020-12-07 | Discharge: 2020-12-07 | Disposition: A | Discharge status: Home / Self Care | Payer: 59 | Source: Ambulatory Visit | Attending: Family Medicine | Admitting: Family Medicine

## 2020-12-07 ENCOUNTER — Other Ambulatory Visit: Payer: Self-pay

## 2020-12-07 DIAGNOSIS — E041 Nontoxic single thyroid nodule: Secondary | ICD-10-CM | POA: Diagnosis not present

## 2020-12-17 ENCOUNTER — Encounter: Payer: PRIVATE HEALTH INSURANCE | Admitting: Gynecologic Oncology

## 2020-12-21 ENCOUNTER — Telehealth: Payer: Self-pay | Admitting: *Deleted

## 2020-12-21 NOTE — Telephone Encounter (Signed)
Attempted to reach the patient to reschedule her appt from 8/29. Left a message for the patient to call the office back

## 2020-12-25 ENCOUNTER — Telehealth: Payer: Self-pay | Admitting: *Deleted

## 2020-12-25 NOTE — Telephone Encounter (Signed)
Attempted to call the patient and reschedule appt, left a message to call the office back

## 2020-12-26 NOTE — Telephone Encounter (Signed)
Rescheduled appt from last week

## 2020-12-27 NOTE — Progress Notes (Signed)
Gynecologic Oncology Return Clinic Visit  12/28/20  Reason for Visit: follow-up after surgery, treatment planning  Treatment History: The patient presented with a 2-year history of a palpable lesion above her clitoris. 11/05/2020: Exophytic supra clitoral lesion excised in clinic.  Pathology from this noted a 1 x 1 x 0.5 cm lesion, biopsy noted to be superficial and assessment of invasion could not be completed. 11/19/2020: PET shows ill-defined hypermetabolic activity of the vulva.  Nodular cutaneous focus of abnormal hypermetabolic activity along the right anterior labia, nonspecific.  Hypermetabolic 1 cm right thyroid nodule, thyroid ultrasound recommended. 11/22/2020: Partial simple anterior vulvectomy, vulvar biopsies.  Findings at the time of surgery were an 8 x 8 mm area that was mildly erythematous and raised at the site of prior excision, stitch is still in place.  No surrounding induration noted.  2 superficial areas of mild skin discoloration on the right vulva were biopsied. 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  Right vulvar biopsies with no dysplasia or carcinoma, 1 showing ulcer with scale crust and the other acute inflammation with fibrosis. Discussed at tumor board. Given difficulty determining if any invasion, only microscopic focus on re-excision, and PET negative for metastatic disease, recommendation made for close surveillance.   Interval History: Patient presents today for follow-up after surgery.  She notes overall doing well.  She has had brief episodes of irritation after moving or drying the area of her anterior vulvar excision.  When this is happened, she has sometimes had some light and brief bleeding.  Overall she has significant less pain related to the incision.  Denies any pruritus.  She reports regular bowel and bladder function.  Past Medical/Surgical History: Past Medical History:  Diagnosis  Date   Adrenal calcification (HCC)    Cancer ([place])    squamous cell carcinoma   COVID 10/10/2020   fever sore throat cough x 8-10-3 days rapid home test and work pcr done, pt does not have pcr results   Dysrhythmia    followed by PCP in [place]  years ago for palpatations and rate control   GERD (gastroesophageal reflux disease)    Headache    rare migraines   History of kidney stones    HTN (hypertension)    Hypothyroidism    Insomnia    Pneumonia    Seizures ([place])    no seizure episodes in "years"    Past Surgical History:  Procedure Laterality Date   ABDOMINAL HYSTERECTOMY     BILATERAL SALPINGECTOMY Bilateral 12/12/2013   Procedure: BILATERAL SALPINGECTOMY;  Surgeon: Margarette Asal, MD;  Location: [facility];  Service: Gynecology;  Laterality: Bilateral;   ESOPHAGOGASTRODUODENOSCOPY N/A 10/01/2015   Dr. Oneida Alar: normal esophagus, mild gastritis, duodenal diverticulum    LAPAROSCOPIC ASSISTED VAGINAL HYSTERECTOMY N/A 12/12/2013   uterine fibroids   LEEP  2004   VULVECTOMY N/A 11/22/2020   Procedure: WIDE EXCISION VULVECTOMY;  Surgeon: Lafonda Mosses, MD;  Location: [facility];  Service: Gynecology;  Laterality: N/A;    Family History  Problem Relation Age of Onset   Cancer Father        Unsure of type of cancer   Hypertension Father    Diabetes Father    Breast cancer Maternal Aunt 67   Stomach cancer Paternal Grandmother    Colon cancer Neg Hx    Endometrial cancer Neg Hx    Pancreatic cancer Neg Hx    Prostate cancer Neg Hx  Social History   Socioeconomic History   Marital status: Married    Spouse name: Not on file   Number of children: Not on file   Years of education: Not on file   Highest education level: Not on file  Occupational History   Occupation: RN    Comment: [employer]   Tobacco Use   Smoking status: Former    Years: 10.00    Types: Cigarettes    Quit date: 2008    Years since quitting: 14.6   Smokeless  tobacco: Never  Vaping Use   Vaping Use: Never used  Substance and Sexual Activity   Alcohol use: No    Alcohol/week: 0.0 standard drinks   Drug use: No   Sexual activity: Yes    Birth control/protection: Surgical  Other Topics Concern   Not on file  Social History Narrative   Not on file   Social Determinants of Health   Financial Resource Strain: Not on file  Food Insecurity: Not on file  Transportation Needs: Not on file  Physical Activity: Not on file  Stress: Not on file  Social Connections: Not on file    Current Medications:  Current Outpatient Medications:    acetaminophen (TYLENOL) 325 MG tablet, Take 650 mg by mouth every 6 (six) hours as needed for moderate pain., Disp: , Rfl:    acidophilus (RISAQUAD) CAPS capsule, Take 1 capsule by mouth daily. (Patient not taking: Reported on 11/14/2020), Disp: , Rfl:    atenolol (TENORMIN) 50 MG tablet, Take 50 mg by mouth 2 (two) times daily., Disp: , Rfl:    Cyanocobalamin (B-12 PO), Take 1 capsule by mouth daily. (Patient not taking: Reported on 11/14/2020), Disp: , Rfl:    furosemide (LASIX) 20 MG tablet, Take 20-40 mg by mouth daily., Disp: , Rfl:    ibuprofen (ADVIL) 800 MG tablet, Take 1 tablet (800 mg total) by mouth every 8 (eight) hours as needed for moderate pain. For AFTER surgery, Disp: 30 tablet, Rfl: 0   LamoTRIgine 100 MG TB24 24 hour tablet, Take 100 mg by mouth daily., Disp: , Rfl:    senna-docusate (SENOKOT-S) 8.6-50 MG tablet, Take 2 tablets by mouth at bedtime. For AFTER surgery, do not take if having diarrhea, Disp: 30 tablet, Rfl: 0   traMADol (ULTRAM) 50 MG tablet, Take 1 tablet (50 mg total) by mouth every 6 (six) hours as needed for severe pain. For AFTER surgery, do not take and drive, Disp: 15 tablet, Rfl: 0   TURMERIC-GINGER PO, Take 1 capsule by mouth daily. (Patient not taking: Reported on 11/14/2020), Disp: , Rfl:    zolpidem (AMBIEN) 10 MG tablet, Take 10 mg by mouth at bedtime., Disp: , Rfl:    Review of Systems: Denies appetite changes, fevers, chills, fatigue, unexplained weight changes. Denies hearing loss, neck lumps or masses, mouth sores, ringing in ears or voice changes. Denies cough or wheezing.  Denies shortness of breath. Denies chest pain or palpitations. Denies leg swelling. Denies abdominal distention, pain, blood in stools, constipation, diarrhea, nausea, vomiting, or early satiety. Denies pain with intercourse, dysuria, frequency, hematuria or incontinence. Denies hot flashes, pelvic pain, vaginal bleeding or vaginal discharge.   Denies joint pain, back pain or muscle pain/cramps. Denies itching, rash, or wounds. Denies dizziness, headaches, numbness or seizures. Denies swollen lymph nodes or glands, denies easy bruising or bleeding. Denies anxiety, depression, confusion, or decreased concentration.  Physical Exam: BP 134/72 (BP Location: Left Arm, Patient Position: Sitting)   Pulse 73  Temp 98.8 F (37.1 C) (Oral)   Resp 16   Ht '5\' 5"'$  (1.651 m)   Wt 206 lb (93.4 kg)   LMP 05/05/2013   SpO2 100%   BMI 34.28 kg/m  General: Alert, oriented, no acute distress. HEENT: Atraumatic, normocephalic, sclera anicteric. Chest: Unlabored breathing on room air. Extremities: Grossly normal range of motion.  Warm, well perfused.  No edema bilaterally. GU: Well-healing anterior vulvar incision with some mild hyperpigmentation of the skin along the incision line.  The incision itself is a little bit difficult to see secondary to new hair growth.  I do not see any areas of ulceration.  2 healing biopsy sites on the right anterior vulva also healing well, sutures still intact at one site and removed today.  Laboratory & Radiologic Studies: None new  Assessment & Plan: [name] is a 40 y.o. 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   Patient is doing well  postoperatively and meeting milestones.  We discussed continued restrictions and expectations.  We discussed again her pathology report.  I gave her a paper copy of this report.  Given pathology findings as well as her discussion at tumor board, I do not think that additional surgical excision or surgical lymph node assessment is warranted.  We recommend that we follow the patient as if she has a diagnosis of vulvar cancer (pathology was not able to confirm whether this was superficially invasive versus carcinoma in situ).  I recommend surveillance visits initially every 3 months.  We discussed that this could alternate between my office and her gynecologist.  At this time, she would like to have visits with me and we will discuss alternating visits at a later time.   We reviewed signs and symptoms that would be concerning for disease recurrence and she knows to call my office if she develops any of these between visits.  28 minutes of total time was spent for this patient encounter, including preparation, face-to-face counseling with the patient and coordination of care, and documentation of the encounter.  Jeral Pinch, MD  Division of Gynecologic Oncology  Department of Obstetrics and Gynecology  [facility]

## 2020-12-28 ENCOUNTER — Encounter: Payer: Self-pay | Admitting: Gynecologic Oncology

## 2020-12-28 ENCOUNTER — Inpatient Hospital Stay: Payer: 59 | Attending: Gynecologic Oncology | Admitting: Gynecologic Oncology

## 2020-12-28 ENCOUNTER — Other Ambulatory Visit: Payer: Self-pay

## 2020-12-28 VITALS — BP 134/72 | HR 73 | Temp 98.8°F | Resp 16 | Ht 65.0 in | Wt 206.0 lb

## 2020-12-28 DIAGNOSIS — Z7189 Other specified counseling: Secondary | ICD-10-CM

## 2020-12-28 DIAGNOSIS — Z9079 Acquired absence of other genital organ(s): Secondary | ICD-10-CM

## 2020-12-28 DIAGNOSIS — C519 Malignant neoplasm of vulva, unspecified: Secondary | ICD-10-CM

## 2020-12-28 NOTE — Patient Instructions (Signed)
You are healing well from surgery!  I will plan to see you back in 3 months.  We will have visits with an exam every 3 months for 2 years.  At some point, we can discuss alternating those visits with my office and her gynecologist.

## 2021-01-24 ENCOUNTER — Other Ambulatory Visit: Payer: Self-pay

## 2021-01-24 ENCOUNTER — Ambulatory Visit
Admission: RE | Admit: 2021-01-24 | Discharge: 2021-01-24 | Disposition: A | Discharge status: Home / Self Care | Payer: Commercial Managed Care - PPO | Source: Ambulatory Visit | Attending: Obstetrics and Gynecology | Admitting: Obstetrics and Gynecology

## 2021-01-24 DIAGNOSIS — Z1231 Encounter for screening mammogram for malignant neoplasm of breast: Secondary | ICD-10-CM

## 2021-01-25 ENCOUNTER — Other Ambulatory Visit: Payer: Self-pay | Admitting: Obstetrics and Gynecology

## 2021-01-25 DIAGNOSIS — N6320 Unspecified lump in the left breast, unspecified quadrant: Secondary | ICD-10-CM

## 2021-02-02 ENCOUNTER — Ambulatory Visit
Admission: RE | Admit: 2021-02-02 | Discharge: 2021-02-02 | Disposition: A | Discharge status: Home / Self Care | Payer: Commercial Managed Care - PPO | Source: Ambulatory Visit | Attending: Obstetrics and Gynecology | Admitting: Obstetrics and Gynecology

## 2021-02-02 ENCOUNTER — Other Ambulatory Visit: Payer: Self-pay | Admitting: Obstetrics and Gynecology

## 2021-02-02 DIAGNOSIS — N6320 Unspecified lump in the left breast, unspecified quadrant: Secondary | ICD-10-CM

## 2021-03-25 ENCOUNTER — Ambulatory Visit
Admission: RE | Admit: 2021-03-25 | Discharge: 2021-03-25 | Disposition: A | Discharge status: Home / Self Care | Payer: Commercial Managed Care - PPO | Source: Ambulatory Visit | Attending: Obstetrics and Gynecology | Admitting: Obstetrics and Gynecology

## 2021-03-25 DIAGNOSIS — N6320 Unspecified lump in the left breast, unspecified quadrant: Secondary | ICD-10-CM

## 2021-03-28 ENCOUNTER — Encounter: Payer: Self-pay | Admitting: Gynecologic Oncology

## 2021-03-28 NOTE — Progress Notes (Signed)
Gynecologic Oncology Return Clinic Visit  03/29/21  Reason for Visit: surveillance in the setting of vulvar cancer  Treatment History: The patient presented with a 2-year history of a palpable lesion above her clitoris. 11/05/2020: Exophytic supra clitoral lesion excised in clinic.  Pathology from this noted a 1 x 1 x 0.5 cm lesion, biopsy noted to be superficial and assessment of invasion could not be completed. 11/19/2020: PET shows ill-defined hypermetabolic activity of the vulva.  Nodular cutaneous focus of abnormal hypermetabolic activity along the right anterior labia, nonspecific.  Hypermetabolic 1 cm right thyroid nodule, thyroid ultrasound recommended. 11/22/2020: Partial simple anterior vulvectomy, vulvar biopsies.  Findings at the time of surgery were an 8 x 8 mm area that was mildly erythematous and raised at the site of prior excision, stitch is still in place.  No surrounding induration noted.  2 superficial areas of mild skin discoloration on the right vulva were biopsied. 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  Right vulvar biopsies with no dysplasia or carcinoma, 1 showing ulcer with scale crust and the other acute inflammation with fibrosis. Discussed at tumor board. Given difficulty determining if any invasion, only microscopic focus on re-excision, and PET negative for metastatic disease, recommendation made for close surveillance.  Interval History: Doing well.  Denies any vaginal eating or discharge.  Has occasional stabbing pain or burning at the location of her prior excision.  Denies any other pain.  Denies pruritus.  Reports normal bowel and bladder function.  Recently had a mammogram that showed a mass favored to be a sebaceous cyst.  She underwent ultrasound in October and again in December, felt to be a benign sebaceous cyst.  Past Medical/Surgical History: Past Medical History:  Diagnosis Date    Adrenal calcification (HCC)    Cancer ([place])    squamous cell carcinoma   COVID 10/10/2020   fever sore throat cough x 8-10-3 days rapid home test and work pcr done, pt does not have pcr results   Dysrhythmia    followed by PCP in [place]  years ago for palpatations and rate control   GERD (gastroesophageal reflux disease)    Headache    rare migraines   History of kidney stones    HTN (hypertension)    Hypothyroidism    Insomnia    Pneumonia    Seizures ([place])    no seizure episodes in "years"    Past Surgical History:  Procedure Laterality Date   ABDOMINAL HYSTERECTOMY     BILATERAL SALPINGECTOMY Bilateral 12/12/2013   Procedure: BILATERAL SALPINGECTOMY;  Surgeon: Margarette Asal, MD;  Location: [facility];  Service: Gynecology;  Laterality: Bilateral;   ESOPHAGOGASTRODUODENOSCOPY N/A 10/01/2015   Dr. Oneida Alar: normal esophagus, mild gastritis, duodenal diverticulum    LAPAROSCOPIC ASSISTED VAGINAL HYSTERECTOMY N/A 12/12/2013   uterine fibroids   LEEP  2004   VULVECTOMY N/A 11/22/2020   Procedure: WIDE EXCISION VULVECTOMY;  Surgeon: Lafonda Mosses, MD;  Location: [facility];  Service: Gynecology;  Laterality: N/A;    Family History  Problem Relation Age of Onset   Cancer Father        Unsure of type of cancer   Hypertension Father    Diabetes Father    Breast cancer Maternal Aunt 30   Stomach cancer Paternal Grandmother    Colon cancer Neg Hx    Endometrial cancer Neg Hx    Pancreatic cancer Neg Hx    Prostate cancer  Neg Hx     Social History   Socioeconomic History   Marital status: Married    Spouse name: Not on file   Number of children: Not on file   Years of education: Not on file   Highest education level: Not on file  Occupational History   Occupation: RN    Comment: [employer]   Tobacco Use   Smoking status: Former    Years: 10.00    Types: Cigarettes    Quit date: 2008    Years since quitting: 14.9   Smokeless  tobacco: Never  Vaping Use   Vaping Use: Never used  Substance and Sexual Activity   Alcohol use: No    Alcohol/week: 0.0 standard drinks   Drug use: No   Sexual activity: Yes    Birth control/protection: Surgical  Other Topics Concern   Not on file  Social History Narrative   Not on file   Social Determinants of Health   Financial Resource Strain: Not on file  Food Insecurity: Not on file  Transportation Needs: Not on file  Physical Activity: Not on file  Stress: Not on file  Social Connections: Not on file    Current Medications:  Current Outpatient Medications:    acetaminophen (TYLENOL) 325 MG tablet, Take 650 mg by mouth every 6 (six) hours as needed for moderate pain., Disp: , Rfl:    atenolol (TENORMIN) 50 MG tablet, Take 50 mg by mouth 2 (two) times daily., Disp: , Rfl:    furosemide (LASIX) 20 MG tablet, Take 20-40 mg by mouth daily., Disp: , Rfl:    LamoTRIgine 100 MG TB24 24 hour tablet, Take 100 mg by mouth daily., Disp: , Rfl:    TURMERIC-GINGER PO, Take 1 capsule by mouth daily., Disp: , Rfl:    zolpidem (AMBIEN) 10 MG tablet, Take 10 mg by mouth at bedtime., Disp: , Rfl:    acidophilus (RISAQUAD) CAPS capsule, Take 1 capsule by mouth daily. (Patient not taking: Reported on 11/14/2020), Disp: , Rfl:    Cyanocobalamin (B-12 PO), Take 1 capsule by mouth daily. (Patient not taking: Reported on 11/14/2020), Disp: , Rfl:    ibuprofen (ADVIL) 800 MG tablet, Take 1 tablet (800 mg total) by mouth every 8 (eight) hours as needed for moderate pain. For AFTER surgery (Patient not taking: Reported on 03/28/2021), Disp: 30 tablet, Rfl: 0   senna-docusate (SENOKOT-S) 8.6-50 MG tablet, Take 2 tablets by mouth at bedtime. For AFTER surgery, do not take if having diarrhea (Patient not taking: Reported on 03/28/2021), Disp: 30 tablet, Rfl: 0   traMADol (ULTRAM) 50 MG tablet, Take 1 tablet (50 mg total) by mouth every 6 (six) hours as needed for severe pain. For AFTER surgery, do not take  and drive (Patient not taking: Reported on 03/28/2021), Disp: 15 tablet, Rfl: 0  Review of Systems: Denies appetite changes, fevers, chills, fatigue, unexplained weight changes. Denies hearing loss, neck lumps or masses, mouth sores, ringing in ears or voice changes. Denies cough or wheezing.  Denies shortness of breath. Denies chest pain or palpitations. Denies leg swelling. Denies abdominal distention, pain, blood in stools, constipation, diarrhea, nausea, vomiting, or early satiety. Denies pain with intercourse, dysuria, frequency, hematuria or incontinence. Denies hot flashes, pelvic pain, vaginal bleeding or vaginal discharge.   Denies joint pain, back pain or muscle pain/cramps. Denies itching, rash, or wounds. Denies dizziness, headaches, numbness or seizures. Denies swollen lymph nodes or glands, denies easy bruising or bleeding. Denies anxiety, depression, confusion, or  decreased concentration.  Physical Exam: BP (!) 149/79 (BP Location: Right Arm, Patient Position: Sitting)   Pulse 61   Temp 98 F (36.7 C) (Oral)   Resp 16   Ht 5\' 5"  (1.651 m)   Wt 205 lb (93 kg)   LMP 05/05/2013   SpO2 100%   BMI 34.11 kg/m  General: Alert, oriented, no acute distress. HEENT: Normocephalic, atraumatic, sclera anicteric. Chest: Clear to auscultation bilaterally.  No wheezes or rhonchi. Cardiovascular: Regular rate and rhythm, no murmurs. Abdomen: Obese, soft, nontender.  Normoactive bowel sounds.  No masses or hepatosplenomegaly appreciated.   Extremities: Grossly normal range of motion.  Warm, well perfused.  No edema bilaterally. Skin: No rashes or lesions noted. Lymphatics: No cervical, supraclavicular, or inguinal adenopathy. GU: External female genitalia notable for scar just above the clitoris and over the mons, well-healing.  2 areas that were biopsied have scarred.  There are several erythematous small lesions that look consistent with ingrown hairs.  No other masses or lesions on  the mons or bilateral labia/vulva.  Laboratory & Radiologic Studies: None new  Assessment & Plan: [name] is a 40 y.o. 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   Patient is overall doing very well and is NED on exam today.  We discussed that she could try using some massage at the area of her prior incision to see if some of the occasional pain that she has is related to scar tissue.  We will plan to continue with surveillance visits every 3 months.  We will continue doing that solely at my office for the time being.  We reviewed signs and symptoms that would be concerning for disease recurrence, the patient knows to call if she develops any of these between visits.   32 minutes of total time was spent for this patient encounter, including preparation, face-to-face counseling with the patient and coordination of care, and documentation of the encounter.  Jeral Pinch, MD  Division of Gynecologic Oncology  Department of Obstetrics and Gynecology  [facility]

## 2021-03-29 ENCOUNTER — Other Ambulatory Visit: Payer: Self-pay

## 2021-03-29 ENCOUNTER — Inpatient Hospital Stay: Payer: Commercial Managed Care - PPO | Attending: Gynecologic Oncology | Admitting: Gynecologic Oncology

## 2021-03-29 ENCOUNTER — Encounter: Payer: Self-pay | Admitting: Gynecologic Oncology

## 2021-03-29 VITALS — BP 149/79 | HR 61 | Temp 98.0°F | Resp 16 | Ht 65.0 in | Wt 205.0 lb

## 2021-03-29 DIAGNOSIS — I10 Essential (primary) hypertension: Secondary | ICD-10-CM | POA: Insufficient documentation

## 2021-03-29 DIAGNOSIS — Z8544 Personal history of malignant neoplasm of other female genital organs: Secondary | ICD-10-CM | POA: Diagnosis present

## 2021-03-29 DIAGNOSIS — E039 Hypothyroidism, unspecified: Secondary | ICD-10-CM | POA: Insufficient documentation

## 2021-03-29 DIAGNOSIS — K219 Gastro-esophageal reflux disease without esophagitis: Secondary | ICD-10-CM | POA: Insufficient documentation

## 2021-03-29 DIAGNOSIS — Z8616 Personal history of COVID-19: Secondary | ICD-10-CM | POA: Diagnosis not present

## 2021-03-29 DIAGNOSIS — Z79899 Other long term (current) drug therapy: Secondary | ICD-10-CM | POA: Diagnosis not present

## 2021-03-29 DIAGNOSIS — Z791 Long term (current) use of non-steroidal anti-inflammatories (NSAID): Secondary | ICD-10-CM | POA: Diagnosis not present

## 2021-03-29 DIAGNOSIS — Z9079 Acquired absence of other genital organ(s): Secondary | ICD-10-CM | POA: Insufficient documentation

## 2021-03-29 DIAGNOSIS — G4700 Insomnia, unspecified: Secondary | ICD-10-CM | POA: Diagnosis not present

## 2021-03-29 DIAGNOSIS — C519 Malignant neoplasm of vulva, unspecified: Secondary | ICD-10-CM

## 2021-03-29 NOTE — Patient Instructions (Signed)
It was good to see you.  I do not see any evidence of cancer on your exam today.  I will see you back in 3 months.  If anything changes or you feel/see new spots, please call to see me sooner.  In terms of the occasional stabbing/burning that you feel at the site of your prior incision, you may want to use a little bit of oil or cream (like BioSkin) and massage the area.  There may be some scar tissue there that could improve with massage therapy.

## 2021-05-06 ENCOUNTER — Other Ambulatory Visit: Payer: Self-pay | Admitting: Surgery

## 2021-05-07 ENCOUNTER — Other Ambulatory Visit: Payer: Commercial Managed Care - PPO

## 2021-06-11 ENCOUNTER — Encounter (HOSPITAL_BASED_OUTPATIENT_CLINIC_OR_DEPARTMENT_OTHER): Payer: Self-pay | Admitting: Surgery

## 2021-06-11 ENCOUNTER — Other Ambulatory Visit: Payer: Self-pay

## 2021-06-14 ENCOUNTER — Encounter (HOSPITAL_BASED_OUTPATIENT_CLINIC_OR_DEPARTMENT_OTHER)
Admission: RE | Admit: 2021-06-14 | Discharge: 2021-06-14 | Disposition: A | Discharge status: Home / Self Care | Payer: Commercial Managed Care - PPO | Source: Ambulatory Visit | Attending: Surgery | Admitting: Surgery

## 2021-06-14 DIAGNOSIS — Z79899 Other long term (current) drug therapy: Secondary | ICD-10-CM | POA: Diagnosis not present

## 2021-06-14 DIAGNOSIS — Z5181 Encounter for therapeutic drug level monitoring: Secondary | ICD-10-CM | POA: Diagnosis not present

## 2021-06-14 DIAGNOSIS — Z01812 Encounter for preprocedural laboratory examination: Secondary | ICD-10-CM | POA: Insufficient documentation

## 2021-06-14 LAB — BASIC METABOLIC PANEL
Anion gap: 6 (ref 5–15)
BUN: 7 mg/dL (ref 6–20)
CO2: 26 mmol/L (ref 22–32)
Calcium: 9.2 mg/dL (ref 8.9–10.3)
Chloride: 105 mmol/L (ref 98–111)
Creatinine, Ser: 0.67 mg/dL (ref 0.44–1.00)
GFR, Estimated: >60 mL/min (ref >60)
Glucose, Bld: 91 mg/dL (ref 70–99)
Potassium: 4.5 mmol/L (ref 3.5–5.1)
Sodium: 137 mmol/L (ref 135–145)

## 2021-06-14 MED ORDER — ENSURE PRE-SURGERY PO LIQD: 296.0000 mL | Freq: Once | ORAL | Status: DC

## 2021-06-14 NOTE — Progress Notes (Signed)

## 2021-06-16 NOTE — H&P (Signed)
REFERRING PHYSICIAN: Margarette Asal, MD  PROVIDER: [name], MD  MRN: [id number] DOB: Dec 21, 1980  Subjective   Chief Complaint: New Consultation (Left breast cyst)   History of Present Illness: [name] is a 40 y.o. female who is seen  as an office consultation at the request of Dr. Matthew Hughes for evaluation of New Consultation (Left breast cyst) .   This is a 41 year old female who was found in October of last year to have a small mass on screening mammography and ultrasound of the left breast. It was determined to be in the skin and potentially consistent with a sebaceous cyst. It was approxi-1 cm in size. It has persisted on follow-up imaging. It is now causing discomfort but no erythema. She has a strong family history of breast cancer having had an aunt die at an early age of breast cancer. She denies nipple discharge. She herself has a history of vulvar cancer. She denies nipple discharge and has had no previous problems regarding her breast  Review of Systems: A complete review of systems was obtained from the patient. I have reviewed this information and discussed as appropriate with the patient. See HPI as well for other ROS.  ROS   Medical History: Past Medical History:  Diagnosis Date   History of cancer   Hypertension   Seizures (CMS-HCC)   There is no problem list on file for this patient.  Past Surgical History:  Procedure Laterality Date   HYSTERECTOMY   VULVECTOMY    Allergies  Allergen Reactions   Albuterol Anaphylaxis, Hives and Shortness Of Breath   Current Outpatient Medications on File Prior to Visit  Medication Sig Dispense Refill   atenoloL (TENORMIN) 50 MG tablet atenolol 50 mg tablet Take 1 tablet twice a day by oral route.   FUROsemide (LASIX) 20 MG tablet furosemide 20 mg tablet   lamoTRIgine (LAMICTAL XR) 100 mg XR tablet daily   zolpidem (AMBIEN) 10 mg tablet zolpidem 10 mg tablet   No current facility-administered  medications on file prior to visit.   Family History  Problem Relation Age of Onset   High blood pressure (Hypertension) Father    Social History   Tobacco Use  Smoking Status Former   Types: Cigarettes   Quit date: 2008   Years since quitting: 15.0  Smokeless Tobacco Never    Social History   Socioeconomic History   Marital status: Married  Tobacco Use   Smoking status: Former  Types: Cigarettes  Quit date: 2008  Years since quitting: 15.0   Smokeless tobacco: Never  Vaping Use   Vaping Use: Never used  Substance and Sexual Activity   Alcohol use: Not Currently   Drug use: Never   Objective:   Vitals:   BP: (!) 144/98  Pulse: (!) 122  SpO2: 96%  Weight: 96 kg (211 lb 9.6 oz)  Height: 163.8 cm (5' 4.5")   Body mass index is 35.76 kg/m.  Physical Exam   She appears well on exam  On breast exam, in the upper outer quadrant of the left breast is A small palpable mass almost a centimeter in size. It is underlying a slightly abnormal appearing mole on the breast. Margins are difficult to discern  There is no axillary adenopathy  Lungs clear CV RRR  Labs, Imaging and Diagnostic Testing: I have reviewed her mammogram, ultrasound  Assessment and Plan:   Diagnoses and all orders for this visit:  Sebaceous cyst of skin of left breast  This is suspected be a sebaceous cyst but given its persistence, visibility on mammograms, discomfort it is causing, her strong family history of breast cancer and her history of vulvar cancer, surgical excision of the mass is strongly recommended for complete histologic evaluation to rule out malignancy. Because of its location this would include the overlying skin mole. I explained the surgical procedure in detail and the reasons for surgery. She is eager to proceed with surgery. We discussed the risk which includes but is not limited to bleeding, infection, the need for further surgery malignancy is found, recurrence,  cardiopulmonary issues, etc. She understands and surgery will be scheduled.

## 2021-06-17 ENCOUNTER — Encounter (HOSPITAL_BASED_OUTPATIENT_CLINIC_OR_DEPARTMENT_OTHER)
Admission: RE | Disposition: A | Discharge status: Home / Self Care | Payer: Self-pay | Source: Ambulatory Visit | Attending: Surgery

## 2021-06-17 ENCOUNTER — Other Ambulatory Visit: Payer: Self-pay

## 2021-06-17 ENCOUNTER — Ambulatory Visit (HOSPITAL_BASED_OUTPATIENT_CLINIC_OR_DEPARTMENT_OTHER): Payer: Commercial Managed Care - PPO | Admitting: Anesthesiology

## 2021-06-17 ENCOUNTER — Ambulatory Visit (HOSPITAL_BASED_OUTPATIENT_CLINIC_OR_DEPARTMENT_OTHER)
Admission: RE | Admit: 2021-06-17 | Discharge: 2021-06-17 | Disposition: A | Discharge status: Home / Self Care | Payer: Commercial Managed Care - PPO | Source: Ambulatory Visit | Attending: Surgery | Admitting: Surgery

## 2021-06-17 ENCOUNTER — Encounter (HOSPITAL_BASED_OUTPATIENT_CLINIC_OR_DEPARTMENT_OTHER): Payer: Self-pay | Admitting: Surgery

## 2021-06-17 DIAGNOSIS — K219 Gastro-esophageal reflux disease without esophagitis: Secondary | ICD-10-CM | POA: Insufficient documentation

## 2021-06-17 DIAGNOSIS — E669 Obesity, unspecified: Secondary | ICD-10-CM | POA: Insufficient documentation

## 2021-06-17 DIAGNOSIS — L720 Epidermal cyst: Secondary | ICD-10-CM | POA: Diagnosis not present

## 2021-06-17 DIAGNOSIS — R569 Unspecified convulsions: Secondary | ICD-10-CM | POA: Diagnosis not present

## 2021-06-17 DIAGNOSIS — N6489 Other specified disorders of breast: Secondary | ICD-10-CM | POA: Diagnosis not present

## 2021-06-17 DIAGNOSIS — E039 Hypothyroidism, unspecified: Secondary | ICD-10-CM | POA: Insufficient documentation

## 2021-06-17 DIAGNOSIS — Z6835 Body mass index (BMI) 35.0-35.9, adult: Secondary | ICD-10-CM | POA: Diagnosis not present

## 2021-06-17 DIAGNOSIS — N6082 Other benign mammary dysplasias of left breast: Secondary | ICD-10-CM | POA: Diagnosis not present

## 2021-06-17 DIAGNOSIS — N6002 Solitary cyst of left breast: Secondary | ICD-10-CM

## 2021-06-17 DIAGNOSIS — Z8544 Personal history of malignant neoplasm of other female genital organs: Secondary | ICD-10-CM | POA: Diagnosis not present

## 2021-06-17 DIAGNOSIS — Z79899 Other long term (current) drug therapy: Secondary | ICD-10-CM

## 2021-06-17 DIAGNOSIS — I10 Essential (primary) hypertension: Secondary | ICD-10-CM | POA: Diagnosis not present

## 2021-06-17 DIAGNOSIS — Z87891 Personal history of nicotine dependence: Secondary | ICD-10-CM | POA: Insufficient documentation

## 2021-06-17 DIAGNOSIS — Z803 Family history of malignant neoplasm of breast: Secondary | ICD-10-CM | POA: Insufficient documentation

## 2021-06-17 HISTORY — PX: BREAST CYST EXCISION: SHX579

## 2021-06-17 SURGERY — EXCISION, CYST, BREAST
Anesthesia: Monitor Anesthesia Care | Site: Breast | Laterality: Left

## 2021-06-17 MED ORDER — ONDANSETRON HCL 4 MG/2ML IJ SOLN: 4.0000 mg | Freq: Once | INTRAMUSCULAR | Status: DC | PRN

## 2021-06-17 MED ORDER — LACTATED RINGERS IV SOLN: INTRAVENOUS | Status: DC

## 2021-06-17 MED ORDER — OXYCODONE HCL 5 MG/5ML PO SOLN: 5.0000 mg | Freq: Once | ORAL | Status: DC | PRN

## 2021-06-17 MED ORDER — TRAMADOL HCL 50 MG PO TABS: 50.0000 mg | ORAL_TABLET | Freq: Four times a day (QID) | ORAL | 0 refills | Status: DC | PRN

## 2021-06-17 MED ORDER — ACETAMINOPHEN 500 MG PO TABS: 1000.0000 mg | ORAL_TABLET | Freq: Once | ORAL | Status: DC

## 2021-06-17 MED ORDER — SCOPOLAMINE 1 MG/3DAYS TD PT72: 1.0000 | MEDICATED_PATCH | TRANSDERMAL | Status: DC

## 2021-06-17 MED ORDER — FENTANYL CITRATE (PF) 100 MCG/2ML IJ SOLN
INTRAMUSCULAR | Status: DC | PRN
  Administered 2021-06-17 (×2): 50 ug via INTRAVENOUS

## 2021-06-17 MED ORDER — FENTANYL CITRATE (PF) 100 MCG/2ML IJ SOLN
INTRAMUSCULAR | Status: AC
  Filled 2021-06-17: qty 2

## 2021-06-17 MED ORDER — PROPOFOL 500 MG/50ML IV EMUL
INTRAVENOUS | Status: AC
  Filled 2021-06-17: qty 50

## 2021-06-17 MED ORDER — ONDANSETRON HCL 4 MG/2ML IJ SOLN
INTRAMUSCULAR | Status: AC
  Filled 2021-06-17: qty 2

## 2021-06-17 MED ORDER — PROPOFOL 500 MG/50ML IV EMUL
INTRAVENOUS | Status: DC | PRN
  Administered 2021-06-17: 200 ug/kg/min via INTRAVENOUS

## 2021-06-17 MED ORDER — MIDAZOLAM HCL 2 MG/2ML IJ SOLN
INTRAMUSCULAR | Status: AC
  Filled 2021-06-17: qty 2

## 2021-06-17 MED ORDER — CEFAZOLIN SODIUM-DEXTROSE 2-4 GM/100ML-% IV SOLN
2.0000 g | INTRAVENOUS | Status: AC
  Administered 2021-06-17: 2 g via INTRAVENOUS

## 2021-06-17 MED ORDER — PROPOFOL 10 MG/ML IV BOLUS
INTRAVENOUS | Status: AC
  Filled 2021-06-17: qty 20

## 2021-06-17 MED ORDER — CHLORHEXIDINE GLUCONATE CLOTH 2 % EX PADS
6.0000 | MEDICATED_PAD | Freq: Once | CUTANEOUS | Status: DC
Start: 2021-06-17 — End: 2021-06-17

## 2021-06-17 MED ORDER — PROPOFOL 10 MG/ML IV BOLUS
INTRAVENOUS | Status: DC | PRN
  Administered 2021-06-17: 30 mg via INTRAVENOUS

## 2021-06-17 MED ORDER — LIDOCAINE 2% (20 MG/ML) 5 ML SYRINGE
INTRAMUSCULAR | Status: AC
  Filled 2021-06-17: qty 5

## 2021-06-17 MED ORDER — MIDAZOLAM HCL 5 MG/5ML IJ SOLN
INTRAMUSCULAR | Status: DC | PRN
Start: 2021-06-17 — End: 2021-06-17
  Administered 2021-06-17: 2 mg via INTRAVENOUS

## 2021-06-17 MED ORDER — FENTANYL CITRATE (PF) 100 MCG/2ML IJ SOLN: 25.0000 ug | INTRAMUSCULAR | Status: DC | PRN

## 2021-06-17 MED ORDER — DROPERIDOL 2.5 MG/ML IJ SOLN: 0.6250 mg | Freq: Once | INTRAMUSCULAR | Status: DC | PRN

## 2021-06-17 MED ORDER — CEFAZOLIN SODIUM-DEXTROSE 2-4 GM/100ML-% IV SOLN
INTRAVENOUS | Status: AC
  Filled 2021-06-17: qty 100

## 2021-06-17 MED ORDER — ONDANSETRON HCL 4 MG/2ML IJ SOLN
INTRAMUSCULAR | Status: DC | PRN
  Administered 2021-06-17: 4 mg via INTRAVENOUS

## 2021-06-17 MED ORDER — ACETAMINOPHEN 500 MG PO TABS
1000.0000 mg | ORAL_TABLET | ORAL | Status: AC
  Administered 2021-06-17: 1000 mg via ORAL

## 2021-06-17 MED ORDER — CHLORHEXIDINE GLUCONATE CLOTH 2 % EX PADS: 6.0000 | MEDICATED_PAD | Freq: Once | CUTANEOUS | Status: DC

## 2021-06-17 MED ORDER — LIDOCAINE HCL (PF) 1 % IJ SOLN
INTRAMUSCULAR | Status: DC | PRN
  Administered 2021-06-17: 10 mL

## 2021-06-17 MED ORDER — OXYCODONE HCL 5 MG PO TABS: 5.0000 mg | ORAL_TABLET | Freq: Once | ORAL | Status: DC | PRN

## 2021-06-17 MED ORDER — ACETAMINOPHEN 500 MG PO TABS
ORAL_TABLET | ORAL | Status: AC
  Filled 2021-06-17: qty 2

## 2021-06-17 SURGICAL SUPPLY — 39 items
ADH SKN CLS APL DERMABOND .7 (GAUZE/BANDAGES/DRESSINGS) ×1
APL PRP STRL LF DISP 70% ISPRP (MISCELLANEOUS) ×1
BLADE SURG 15 STRL LF DISP TIS (BLADE) ×2 IMPLANT
BLADE SURG 15 STRL SS (BLADE) ×2
CANISTER SUCT 1200ML W/VALVE (MISCELLANEOUS) IMPLANT
CHLORAPREP W/TINT 26 (MISCELLANEOUS) ×3 IMPLANT
CLIP TI WIDE RED SMALL 6 (CLIP) IMPLANT
COVER BACK TABLE 60X90IN (DRAPES) ×3 IMPLANT
COVER MAYO STAND STRL (DRAPES) ×3 IMPLANT
DERMABOND ADVANCED (GAUZE/BANDAGES/DRESSINGS) ×1
DERMABOND ADVANCED .7 DNX12 (GAUZE/BANDAGES/DRESSINGS) ×2 IMPLANT
DRAPE LAPAROTOMY 100X72 PEDS (DRAPES) ×3 IMPLANT
DRAPE UTILITY XL STRL (DRAPES) ×3 IMPLANT
ELECT REM PT RETURN 9FT ADLT (ELECTROSURGICAL) ×2
ELECTRODE REM PT RTRN 9FT ADLT (ELECTROSURGICAL) ×2 IMPLANT
GAUZE SPONGE 4X4 12PLY STRL LF (GAUZE/BANDAGES/DRESSINGS) ×3 IMPLANT
GLOVE SURG SIGNA 7.5 PF LTX (GLOVE) ×3 IMPLANT
GOWN STRL REUS W/ TWL LRG LVL3 (GOWN DISPOSABLE) ×2 IMPLANT
GOWN STRL REUS W/ TWL XL LVL3 (GOWN DISPOSABLE) ×2 IMPLANT
GOWN STRL REUS W/TWL LRG LVL3 (GOWN DISPOSABLE) ×2
GOWN STRL REUS W/TWL XL LVL3 (GOWN DISPOSABLE) ×2
KIT MARKER MARGIN INK (KITS) ×3 IMPLANT
NDL HYPO 25X1 1.5 SAFETY (NEEDLE) ×2 IMPLANT
NEEDLE HYPO 25X1 1.5 SAFETY (NEEDLE) ×2 IMPLANT
NS IRRIG 1000ML POUR BTL (IV SOLUTION) ×3 IMPLANT
PACK BASIN DAY SURGERY FS (CUSTOM PROCEDURE TRAY) ×3 IMPLANT
PENCIL SMOKE EVACUATOR (MISCELLANEOUS) ×3 IMPLANT
SLEEVE SCD COMPRESS KNEE MED (STOCKING) IMPLANT
SPIKE FLUID TRANSFER (MISCELLANEOUS) IMPLANT
SPONGE T-LAP 4X18 ~~LOC~~+RFID (SPONGE) ×3 IMPLANT
SUT MNCRL AB 4-0 PS2 18 (SUTURE) ×3 IMPLANT
SUT SILK 2 0 SH (SUTURE) ×3 IMPLANT
SUT VIC AB 3-0 SH 27 (SUTURE) ×2
SUT VIC AB 3-0 SH 27X BRD (SUTURE) ×2 IMPLANT
SYR CONTROL 10ML LL (SYRINGE) ×3 IMPLANT
TOWEL GREEN STERILE FF (TOWEL DISPOSABLE) ×3 IMPLANT
TRAY FAXITRON CT DISP (TRAY / TRAY PROCEDURE) IMPLANT
TUBE CONNECTING 20X1/4 (TUBING) IMPLANT
YANKAUER SUCT BULB TIP NO VENT (SUCTIONS) IMPLANT

## 2021-06-17 NOTE — Discharge Instructions (Addendum)
TO WHOM IT MAY CONCERN:  Ms. Silfies may return to work on 06/19/21.  Her only restriction is no lifting over 15 to 20 pounds for one week.  She has no other limits  Thank you;  Coralie Keens, MD [place] Office Phone Number [phone]  BREAST BIOPSY/ PARTIAL MASTECTOMY: POST OP INSTRUCTIONS  Always review your discharge instruction sheet given to you by the facility where your surgery was performed.  IF YOU HAVE DISABILITY OR FAMILY LEAVE FORMS, YOU MUST BRING THEM TO THE OFFICE FOR PROCESSING.  DO NOT GIVE THEM TO YOUR DOCTOR.  A prescription for pain medication may be given to you upon discharge.  Take your pain medication as prescribed, if needed.  If narcotic pain medicine is not needed, then you may take acetaminophen (Tylenol) or ibuprofen (Advil) as needed. Take your usually prescribed medications unless otherwise directed If you need a refill on your pain medication, please contact your pharmacy.  They will contact our office to request authorization.  Prescriptions will not be filled after 5pm or on week-ends. You should eat very light the first 24 hours after surgery, such as soup, crackers, pudding, etc.  Resume your normal diet the day after surgery. Most patients will experience some swelling and bruising in the breast.  Ice packs and a good support bra will help.  Swelling and bruising can take several days to resolve.  It is common to experience some constipation if taking pain medication after surgery.  Increasing fluid intake and taking a stool softener will usually help or prevent this problem from occurring.  A mild laxative (Milk of Magnesia or Miralax) should be taken according to package directions if there are no bowel movements after 48 hours. Unless discharge instructions indicate otherwise, you may remove your bandages 24-48 hours after surgery, and you may shower at that time.  You may have  steri-strips (small skin tapes) in place directly over the incision.  These strips should be left on the skin for 7-10 days.  If your surgeon used skin glue on the incision, you may shower in 24 hours.  The glue will flake off over the next 2-3 weeks.  Any sutures or staples will be removed at the office during your follow-up visit. ACTIVITIES:  You may resume regular daily activities (gradually increasing) beginning the next day.  Wearing a good support bra or sports bra minimizes pain and swelling.  You may have sexual intercourse when it is comfortable. You may drive when you no longer are taking prescription pain medication, you can comfortably wear a seatbelt, and you can safely maneuver your car and apply brakes. RETURN TO WORK:  ______________________________________________________________________________________ Dennis Bast should see your doctor in the office for a follow-up appointment approximately two weeks after your surgery.  Your doctors nurse will typically make your follow-up appointment when she calls you with your pathology report.  Expect your pathology report 2-3 business days after your surgery.  You may call to check if you do not hear from [place] after three days. OTHER INSTRUCTIONS: OK TO SHOWER STARTING TOMORROW NO VIGOROUS ACTIVITY FOR ONE WEEK ICE PACK, TYLENOL, AND IBUPROFEN ALSO FOR PAIN _______________________________________________________________________________________________ _____________________________________________________________________________________________________________________________________ _____________________________________________________________________________________________________________________________________ _____________________________________________________________________________________________________________________________________  WHEN TO CALL YOUR DOCTOR: Fever over 101.0 Nausea and/or vomiting. Extreme swelling or  bruising. Continued bleeding from incision. Increased pain, redness, or drainage from the incision.  The clinic staff is available to answer your questions during regular business hours.  Please  dont hesitate to call and ask to speak to one of the nurses for clinical concerns.  If you have a medical emergency, go to the nearest emergency room or call 911.  A surgeon from [facility] is always on call at the hospital.  For further questions, please visit [email or web address]    No Tylenol until after 1:15pm today if needed  Post Anesthesia Home Care Instructions  Activity: Get plenty of rest for the remainder of the day. A responsible individual must stay with you for 24 hours following the procedure.  For the next 24 hours, DO NOT: -Drive a car -Paediatric nurse -Drink alcoholic beverages -Take any medication unless instructed by your physician -Make any legal decisions or sign important papers.  Meals: Start with liquid foods such as gelatin or soup. Progress to regular foods as tolerated. Avoid greasy, spicy, heavy foods. If nausea and/or vomiting occur, drink only clear liquids until the nausea and/or vomiting subsides. Call your physician if vomiting continues.  Special Instructions/Symptoms: Your throat may feel dry or sore from the anesthesia or the breathing tube placed in your throat during surgery. If this causes discomfort, gargle with warm salt water. The discomfort should disappear within 24 hours.  If you had a scopolamine patch placed behind your ear for the management of post- operative nausea and/or vomiting:  1. The medication in the patch is effective for 72 hours, after which it should be removed.  Wrap patch in a tissue and discard in the trash. Wash hands thoroughly with soap and water. 2. You may remove the patch earlier than 72 hours if you experience unpleasant side effects which may include dry mouth, dizziness or visual disturbances. 3.  Avoid touching the patch. Wash your hands with soap and water after contact with the patch.

## 2021-06-17 NOTE — Anesthesia Postprocedure Evaluation (Signed)
Anesthesia Post Note  Patient: [name]  Procedure(s) Performed: CYST EXCISION LEFT BREAST (Left: Breast)     Patient location during evaluation: PACU Anesthesia Type: MAC Level of consciousness: awake and alert Pain management: pain level controlled Vital Signs Assessment: post-procedure vital signs reviewed and stable Respiratory status: spontaneous breathing and respiratory function stable Cardiovascular status: stable Postop Assessment: no apparent nausea or vomiting Anesthetic complications: no   No notable events documented.  Last Vitals:  Vitals:   06/17/21 0931 06/17/21 0945  BP: 128/73 (!) 132/96  Pulse: 60 64  Resp: 18 20  Temp:    SpO2: 99% 95%    Last Pain:  Vitals:   06/17/21 0931  TempSrc:   PainSc: 3                  Candra R Elham Fini

## 2021-06-17 NOTE — Op Note (Signed)
° °  [name] 06/17/2021   Pre-op Diagnosis: LEFT BREAST CYST AND ABNORMAL LEFT BREAST SKIN LESIONS X 2     Post-op Diagnosis: SAME  Procedure(s): EXCISION LEFT BREAST CYST (1 CM) EXCISION LEFT BREAST ABNORMAL SKIN LESION X 2 (7 MM, 5 MM)  Surgeon(s): Coralie Keens, MD Carlena Hurl, PA-C  Anesthesia: Monitor Anesthesia Care  Staff:  Circulator: Donnita Falls, RN Scrub Person: Lorenza Burton, CST  Estimated Blood Loss: Minimal               Specimens: SENT TO PATH   Indications: This is a 41 year old female who presents with a left breast cyst in the upper outer quadrant.  This is nearly involving the skin on mammogram and ultrasound.  Decision was made to proceed with complete surgical excision.  She also has been found to have 2 abnormal appearing skin lesions on the lower outer quadrant of the left breast and would like these excised as well for histologic evaluation  Findings: The left breast cyst in the upper outer quadrant measured approximate 1 cm.  The 2 abnormal skin lesions on the lower outer quadrant of the left breast measured 7 mm and 5 mm respectively  Procedure: The patient brought to operating and identified as the correct patient.  She was placed upon the operating table and anesthesia was induced.  Her left breast was prepped and draped in usual sterile fashion.  I anesthetized the skin around the 2 abnormal skin lesions and around the cyst with lidocaine.  We then performed an elliptical incision separately around each of the lesions including the palpable cyst and previous scar on the breast.  The 2 abnormal skin lesions were excised with a scalpel and sent together to pathology.  They were right adjacent to each other.  The skin cyst which also had some abnormal skin overlying the top was excised in elliptical fashion as well going down to the breast tissue.  The cyst was sent separately to pathology.  Hemostasis was achieved with cautery.  The cyst  excision site was closed with interrupted 3-0 Vicryl sutures and running 4-0 Monocryl.  The abnormal skin lesion sites were closed with 4-0 Monocryl.  Dermabond was then applied.  The patient tolerated the procedure well.  All the counts were correct at the end of the procedure.  The patient was then taken in a stable condition from the operating room to the recovery room.          Coralie Keens   Date: 06/17/2021  Time: 9:06 AM

## 2021-06-17 NOTE — Transfer of Care (Signed)
Immediate Anesthesia Transfer of Care Note  Patient: [name]  Procedure(s) Performed: CYST EXCISION LEFT BREAST (Left: Breast)  Patient Location: PACU  Anesthesia Type:MAC  Level of Consciousness: awake  Airway & Oxygen Therapy: Patient Spontanous Breathing and Patient connected to face mask oxygen  Post-op Assessment: Report given to RN and Post -op Vital signs reviewed and stable  Post vital signs: Reviewed and stable  Last Vitals:  Vitals Value Taken Time  BP 105/93 06/17/21 0912  Temp    Pulse 73 06/17/21 0913  Resp 11 06/17/21 0913  SpO2 96 % 06/17/21 0913  Vitals shown include unvalidated device data.  Last Pain:  Vitals:   06/17/21 0712  TempSrc: Oral  PainSc: 0-No pain         Complications: No notable events documented.

## 2021-06-17 NOTE — Interval H&P Note (Signed)
History and Physical Interval Note:the patient has 2 abnormal appearing skin lesions on the lower outer quadrant of her left breast.  Based on exam and location, malignancy can not be excluded.  They are close to the cyst on the left breast.  We will excise these as well and send to pathology for evaluation.  06/17/2021 7:24 AM  [name]  has presented today for surgery, with the diagnosis of LEFT BREAST CYST.  The various methods of treatment have been discussed with the patient and family. After consideration of risks, benefits and other options for treatment, the patient has consented to  Procedure(s): CYST EXCISION LEFT BREAST (Left) as a surgical intervention.  The patient's history has been reviewed, patient examined, no change in status, stable for surgery.  I have reviewed the patient's chart and labs.  Questions were answered to the patient's satisfaction.     Coralie Keens

## 2021-06-17 NOTE — Interval H&P Note (Signed)
History and Physical Interval Note: no change in H and P  06/17/2021 7:10 AM  [name]  has presented today for surgery, with the diagnosis of LEFT BREAST CYST.  The various methods of treatment have been discussed with the patient and family. After consideration of risks, benefits and other options for treatment, the patient has consented to  Procedure(s): CYST EXCISION LEFT BREAST (Left) as a surgical intervention.  The patient's history has been reviewed, patient examined, no change in status, stable for surgery.  I have reviewed the patient's chart and labs.  Questions were answered to the patient's satisfaction.     Coralie Keens

## 2021-06-17 NOTE — Anesthesia Preprocedure Evaluation (Addendum)
Anesthesia Evaluation  Patient identified by MRN, date of birth, ID band Patient awake    Reviewed: Allergy & Precautions, NPO status , Patient's Chart, lab work & pertinent test results  Airway Mallampati: II  TM Distance: >3 FB Neck ROM: Full    Dental no notable dental hx. (+) Teeth Intact, Dental Advisory Given, Implants,    Pulmonary former smoker,    Pulmonary exam normal breath sounds clear to auscultation       Cardiovascular Exercise Tolerance: Good hypertension, Pt. on home beta blockers and Pt. on medications Normal cardiovascular exam+ dysrhythmias  Rhythm:Regular Rate:Normal     Neuro/Psych  Headaches, Seizures -, Well Controlled,     GI/Hepatic GERD  ,  Endo/Other  Hypothyroidism   Renal/GU   Female GU complaint (Vulvar ca)     Musculoskeletal negative musculoskeletal ROS (+)   Abdominal (+) + obese (BMI 34.5),   Peds  Hematology   Anesthesia Other Findings   Reproductive/Obstetrics negative OB ROS                             Anesthesia Physical Anesthesia Plan  ASA: 2  Anesthesia Plan: MAC   Post-op Pain Management: Tylenol PO (pre-op)*   Induction: Intravenous  PONV Risk Score and Plan: 2 and Propofol infusion, TIVA, Treatment may vary due to age or medical condition, Ondansetron and Dexamethasone  Airway Management Planned: Natural Airway and Simple Face Mask  Additional Equipment: None  Intra-op Plan:   Post-operative Plan:   Informed Consent: I have reviewed the patients History and Physical, chart, labs and discussed the procedure including the risks, benefits and alternatives for the proposed anesthesia with the patient or authorized representative who has indicated his/her understanding and acceptance.     Dental advisory given  Plan Discussed with: CRNA and Anesthesiologist  Anesthesia Plan Comments:         Anesthesia Quick Evaluation

## 2021-06-18 ENCOUNTER — Encounter (HOSPITAL_BASED_OUTPATIENT_CLINIC_OR_DEPARTMENT_OTHER): Payer: Self-pay | Admitting: Surgery

## 2021-06-18 LAB — SURGICAL PATHOLOGY

## 2021-06-24 ENCOUNTER — Other Ambulatory Visit: Payer: Self-pay

## 2021-06-24 ENCOUNTER — Encounter: Payer: Self-pay | Admitting: Gynecologic Oncology

## 2021-06-24 ENCOUNTER — Inpatient Hospital Stay: Payer: Commercial Managed Care - PPO | Attending: Gynecologic Oncology | Admitting: Gynecologic Oncology

## 2021-06-24 VITALS — BP 145/88 | HR 77 | Temp 98.6°F | Resp 18 | Ht 64.96 in | Wt 211.9 lb

## 2021-06-24 DIAGNOSIS — B3731 Acute candidiasis of vulva and vagina: Secondary | ICD-10-CM | POA: Diagnosis not present

## 2021-06-24 DIAGNOSIS — Z8544 Personal history of malignant neoplasm of other female genital organs: Secondary | ICD-10-CM | POA: Diagnosis not present

## 2021-06-24 DIAGNOSIS — C519 Malignant neoplasm of vulva, unspecified: Secondary | ICD-10-CM

## 2021-06-24 MED ORDER — FLUCONAZOLE 150 MG PO TABS: 150.0000 mg | ORAL_TABLET | Freq: Every day | ORAL | 1 refills | Status: DC

## 2021-06-24 NOTE — Patient Instructions (Addendum)
It was good to see you today.  I do not see or feel any evidence of cancer recurrence on your exam.   ? ?Because of your yeast infection, I have sent a one-time dose with a refill of Diflucan to your pharmacy.  I like to see you back in a month, after your symptoms have resolved, to make sure that the redness I am seeing is just related to your yeast infection. ? ?Otherwise, we will continue with visits every 3 months.  As always, if you develop any new or concerning symptoms before your next visit, please call to see me sooner. ?

## 2021-06-24 NOTE — Progress Notes (Signed)
Gynecologic Oncology Return Clinic Visit  06/24/2021  Reason for Visit: surveillance in the setting of vulvar cancer  Treatment History: The patient presented with a 2-year history of a palpable lesion above her clitoris. 11/05/2020: Exophytic supra clitoral lesion excised in clinic.  Pathology from this noted a 1 x 1 x 0.5 cm lesion, biopsy noted to be superficial and assessment of invasion could not be completed. 11/19/2020: PET shows ill-defined hypermetabolic activity of the vulva.  Nodular cutaneous focus of abnormal hypermetabolic activity along the right anterior labia, nonspecific.  Hypermetabolic 1 cm right thyroid nodule, thyroid ultrasound recommended. 11/22/2020: Partial simple anterior vulvectomy, vulvar biopsies.  Findings at the time of surgery were an 8 x 8 mm area that was mildly erythematous and raised at the site of prior excision, stitch is still in place.  No surrounding induration noted.  2 superficial areas of mild skin discoloration on the right vulva were biopsied. 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  Right vulvar biopsies with no dysplasia or carcinoma, 1 showing ulcer with scale crust and the other acute inflammation with fibrosis. Discussed at tumor board. Given difficulty determining if any invasion, only microscopic focus on re-excision, and PET negative for metastatic disease, recommendation made for close surveillance.  Interval History: On 2/27, she underwent excision of left breast cyst and abnormal skin lesion x2.  Pathology revealed benign intradermal nevus, benign epidermal inclusion cyst with inflammatory changes consistent with prior rupture.  Because of antibiotics for at the time of surgery on 2/27, the patient thinks she has developed a yeast infection.  She notes some vulvar and vaginal pruritus as well as vaginal discharge the last couple of days.  She also feels that things are  mildly edematous and she has some burning.  Otherwise, she denies any significant symptoms since her last visit with me.  She has not felt any new lesions.  She saw a small spot above her clitoris that she was not sure if this represented a new area or her healed incision.  Past Medical/Surgical History: Past Medical History:  Diagnosis Date   Adrenal calcification (HCC)    Cancer ([place])    squamous cell carcinoma, vulvar ca 2022   COVID 10/10/2020   fever sore throat cough x 8-10-3 days rapid home test and work pcr done, pt does not have pcr results   Dysrhythmia    followed by PCP in [place]  years ago for palpatations and rate control   GERD (gastroesophageal reflux disease)    Headache    rare migraines   History of kidney stones    HTN (hypertension)    Hypothyroidism    Insomnia    Pneumonia    Seizures ([place])    no seizure episodes in "years"    Past Surgical History:  Procedure Laterality Date   ABDOMINAL HYSTERECTOMY     BILATERAL SALPINGECTOMY Bilateral 12/12/2013   Procedure: BILATERAL SALPINGECTOMY;  Surgeon: Margarette Asal, MD;  Location: [facility];  Service: Gynecology;  Laterality: Bilateral;   BREAST CYST EXCISION Left 06/17/2021   Procedure: CYST EXCISION LEFT BREAST;  Surgeon: Coralie Keens, MD;  Location: [facility];  Service: General;  Laterality: Left;   ESOPHAGOGASTRODUODENOSCOPY N/A 10/01/2015   Dr. Oneida Alar: normal esophagus, mild gastritis, duodenal diverticulum    LAPAROSCOPIC ASSISTED VAGINAL HYSTERECTOMY N/A 12/12/2013   uterine fibroids   LEEP  2004   VULVECTOMY N/A 11/22/2020   Procedure: WIDE EXCISION VULVECTOMY;  Surgeon: Lafonda Mosses, MD;  Location: [facility];  Service: Gynecology;  Laterality: N/A;    Family History  Problem Relation Age of Onset   Cancer Father        Unsure of type of cancer   Hypertension Father    Diabetes Father    Breast cancer Maternal Aunt 54   Stomach cancer Paternal  Grandmother    Colon cancer Neg Hx    Endometrial cancer Neg Hx    Pancreatic cancer Neg Hx    Prostate cancer Neg Hx     Social History   Socioeconomic History   Marital status: Married    Spouse name: Not on file   Number of children: Not on file   Years of education: Not on file   Highest education level: Not on file  Occupational History   Occupation: RN    Comment: [employer]   Tobacco Use   Smoking status: Former    Years: 10.00    Types: Cigarettes    Quit date: 2008    Years since quitting: 15.1   Smokeless tobacco: Never  Vaping Use   Vaping Use: Never used  Substance and Sexual Activity   Alcohol use: No    Alcohol/week: 0.0 standard drinks   Drug use: No   Sexual activity: Yes    Birth control/protection: Surgical  Other Topics Concern   Not on file  Social History Narrative   Not on file   Social Determinants of Health   Financial Resource Strain: Not on file  Food Insecurity: Not on file  Transportation Needs: Not on file  Physical Activity: Not on file  Stress: Not on file  Social Connections: Not on file    Current Medications:  Current Outpatient Medications:    fluconazole (DIFLUCAN) 150 MG tablet, Take 1 tablet (150 mg total) by mouth daily., Disp: 1 tablet, Rfl: 1   acetaminophen (TYLENOL) 325 MG tablet, Take 650 mg by mouth every 6 (six) hours as needed for moderate pain., Disp: , Rfl:    atenolol (TENORMIN) 50 MG tablet, Take 50 mg by mouth 2 (two) times daily., Disp: , Rfl:    furosemide (LASIX) 20 MG tablet, Take 20-40 mg by mouth daily., Disp: , Rfl:    LamoTRIgine 100 MG TB24 24 hour tablet, Take 100 mg by mouth daily., Disp: , Rfl:    traMADol (ULTRAM) 50 MG tablet, Take 1 tablet (50 mg total) by mouth every 6 (six) hours as needed., Disp: 20 tablet, Rfl: 0   zolpidem (AMBIEN) 10 MG tablet, Take 10 mg by mouth at bedtime., Disp: , Rfl:   Review of Systems: Denies appetite changes, fevers, chills, fatigue, unexplained weight  changes. Denies hearing loss, neck lumps or masses, mouth sores, ringing in ears or voice changes. Denies cough or wheezing.  Denies shortness of breath. Denies chest pain or palpitations. Denies leg swelling. Denies abdominal distention, pain, blood in stools, constipation, diarrhea, nausea, vomiting, or early satiety. Denies pain with intercourse, dysuria, frequency, hematuria or incontinence. Denies hot flashes, pelvic pain, vaginal bleeding.   Denies joint pain, back pain or muscle pain/cramps. Denies rash, or wounds. Denies dizziness, headaches, numbness or seizures. Denies swollen lymph nodes or glands, denies easy bruising or bleeding. Denies anxiety, depression, confusion, or decreased concentration.  Physical Exam: BP (!) 145/88 (BP Location: Right Arm, Patient Position: Sitting)    Pulse 77    Temp 98.6 F (37 C) (Oral)    Resp 18  Ht 5' 4.96" (1.65 m)    Wt 211 lb 14.4 oz (96.1 kg)    LMP 05/05/2013    SpO2 100%    BMI 35.30 kg/m  General: Alert, oriented, no acute distress. HEENT: Normocephalic, atraumatic, sclera anicteric. Chest: Clear to auscultation bilaterally.  No wheezes or rhonchi. Cardiovascular: Regular rate and rhythm, no murmurs. Abdomen: Obese, soft, nontender.  Normoactive bowel sounds.  No masses or hepatosplenomegaly appreciated.   Extremities: Grossly normal range of motion.  Warm, well perfused.  No edema bilaterally. Skin: No rashes or lesions noted. Lymphatics: No cervical, supraclavicular, or inguinal adenopathy. GU: External genitalia is mildly erythematous and edematous.  There is what appears to be a healed boil along the right lateral mons. After application of acetic acid, there is some mild erythema just superior to the clitoris, that is more prominent after application of acetic acid.  This area is somewhat difficult to see well given patient's edema in the setting of vulvar and vaginal candidiasis as well as external hair.  No definitive new  lesions.  Laboratory & Radiologic Studies: See HPI  Assessment & Plan: [name] is a 41 y.o. 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   Patient has some skin changes at the very inferior aspect of her prior incision with mild erythema.  It is a little bit difficult to examine the area given the length of her hair as well as what I suspect is a concurrent vaginal and vulvar candidiasis.  I will send a dose of Diflucan to her pharmacy with a refill if needed.  I discussed having her return in 1 month for repeat exam.   We will otherwise plan to continue with surveillance visits every 3 months.  We will continue doing that solely at my office for the time being.  We reviewed signs and symptoms that would be concerning for disease recurrence, the patient knows to call if she develops any of these between visits.  28 minutes of total time was spent for this patient encounter, including preparation, face-to-face counseling with the patient and coordination of care, and documentation of the encounter.  Jeral Pinch, MD  Division of Gynecologic Oncology  Department of Obstetrics and Gynecology  [facility]

## 2021-07-24 ENCOUNTER — Telehealth: Payer: Self-pay | Admitting: *Deleted

## 2021-07-24 NOTE — Telephone Encounter (Signed)
Pt called in wanting to reschedule her appointment. Attempted to return pt's phone call but unable to reach pt. LVM for return call.  ?

## 2021-07-25 ENCOUNTER — Ambulatory Visit: Payer: Commercial Managed Care - PPO | Admitting: Gynecologic Oncology

## 2021-07-25 NOTE — Telephone Encounter (Signed)
Left message for patient, attempted to reschedule her appointment today as per patient request. Unable to contact her, left message requesting return call.  ?

## 2021-08-07 ENCOUNTER — Telehealth: Payer: Self-pay | Admitting: *Deleted

## 2021-08-07 NOTE — Telephone Encounter (Signed)
Returned a call to patient and rescheduled for 07/2521 @ 3:30pm ?

## 2021-08-13 ENCOUNTER — Other Ambulatory Visit: Payer: Self-pay

## 2021-08-13 ENCOUNTER — Encounter: Payer: Self-pay | Admitting: Gynecologic Oncology

## 2021-08-13 ENCOUNTER — Inpatient Hospital Stay: Payer: Commercial Managed Care - PPO | Attending: Gynecologic Oncology | Admitting: Gynecologic Oncology

## 2021-08-13 VITALS — BP 152/88 | HR 68 | Temp 98.0°F | Resp 18 | Ht 64.5 in | Wt 212.0 lb

## 2021-08-13 DIAGNOSIS — Z8544 Personal history of malignant neoplasm of other female genital organs: Secondary | ICD-10-CM | POA: Diagnosis present

## 2021-08-13 DIAGNOSIS — C519 Malignant neoplasm of vulva, unspecified: Secondary | ICD-10-CM

## 2021-08-13 NOTE — Patient Instructions (Signed)
It was good to see you today.  I do not see any evidence of cancer recurrence on your exam.  I will see you back in 3 months.  If you develop any new symptoms before then, please call to see me sooner. ?

## 2021-08-13 NOTE — Progress Notes (Signed)
Gynecologic Oncology Return Clinic Visit ? ?08/13/2021 ? ?Reason for Visit: surveillance in the setting of vulvar cancer ? ?Treatment History: ?The patient presented with a 2-year history of a palpable lesion above her clitoris. ?11/05/2020: Exophytic supra clitoral lesion excised in clinic.  Pathology from this noted a 1 x 1 x 0.5 cm lesion, biopsy noted to be superficial and assessment of invasion could not be completed. ?11/19/2020: PET shows ill-defined hypermetabolic activity of the vulva.  Nodular cutaneous focus of abnormal hypermetabolic activity along the right anterior labia, nonspecific.  Hypermetabolic 1 cm right thyroid nodule, thyroid ultrasound recommended. ?11/22/2020: Partial simple anterior vulvectomy, vulvar biopsies.  Findings at the time of surgery were an 8 x 8 mm area that was mildly erythematous and raised at the site of prior excision, stitch is still in place.  No surrounding induration noted.  2 superficial areas of mild skin discoloration on the right vulva were biopsied. 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  Right vulvar biopsies with no dysplasia or carcinoma, 1 showing ulcer with scale crust and the other acute inflammation with fibrosis. ?Discussed at tumor board. Given difficulty determining if any invasion, only microscopic focus on re-excision, and PET negative for metastatic disease, recommendation made for close surveillance. ? ?Interval History: ?Reports overall doing well.  Had to reschedule her recent visit with me secondary to being sick.  Tested negative for COVID, but thinks that she probably had COVID.  Reports complete resolution of her vulvar symptoms after taking Diflucan.  Denies any vulvar burning, pruritus or pain.  Denies any bleeding or discharge. ? ?Past Medical/Surgical History: ?Past Medical History:  ?Diagnosis Date  ? Adrenal calcification ([place])   ? Cancer [facility])   ? squamous cell  carcinoma, vulvar ca 2022  ? COVID 10/10/2020  ? fever sore throat cough x 8-10-3 days rapid home test and work pcr done, pt does not have pcr results  ? Dysrhythmia   ? followed by PCP in [place]  years ago for palpatations and rate control  ? GERD (gastroesophageal reflux disease)   ? Headache   ? rare migraines  ? History of kidney stones   ? HTN (hypertension)   ? Hypothyroidism   ? Insomnia   ? Pneumonia   ? Seizures ([place])   ? no seizure episodes in "years"  ? ? ?Past Surgical History:  ?Procedure Laterality Date  ? ABDOMINAL HYSTERECTOMY    ? BILATERAL SALPINGECTOMY Bilateral 12/12/2013  ? Procedure: BILATERAL SALPINGECTOMY;  Surgeon: Margarette Asal, MD;  Location: [facility];  Service: Gynecology;  Laterality: Bilateral;  ? BREAST CYST EXCISION Left 06/17/2021  ? Procedure: CYST EXCISION LEFT BREAST;  Surgeon: Coralie Keens, MD;  Location: [facility];  Service: General;  Laterality: Left;  ? ESOPHAGOGASTRODUODENOSCOPY N/A 10/01/2015  ? Dr. Oneida Alar: normal esophagus, mild gastritis, duodenal diverticulum   ? LAPAROSCOPIC ASSISTED VAGINAL HYSTERECTOMY N/A 12/12/2013  ? uterine fibroids  ? LEEP  2004  ? VULVECTOMY N/A 11/22/2020  ? Procedure: WIDE EXCISION VULVECTOMY;  Surgeon: Lafonda Mosses, MD;  Location: [facility];  Service: Gynecology;  Laterality: N/A;  ? ? ?Family History  ?Problem Relation Age of Onset  ? Cancer Father   ?     Unsure of type of cancer  ? Hypertension Father   ? Diabetes Father   ? Breast cancer Maternal Aunt 30  ? Stomach cancer Paternal Grandmother   ? Colon cancer Neg Hx   ?  Endometrial cancer Neg Hx   ? Pancreatic cancer Neg Hx   ? Prostate cancer Neg Hx   ? ? ?Social History  ? ?Socioeconomic History  ? Marital status: Married  ?  Spouse name: Not on file  ? Number of children: Not on file  ? Years of education: Not on file  ? Highest education level: Not on file  ?Occupational History  ? Occupation: Therapist, sports  ?  Comment: [employer]   ?Tobacco  Use  ? Smoking status: Former  ?  Years: 10.00  ?  Types: Cigarettes  ?  Quit date: 2008  ?  Years since quitting: 15.3  ? Smokeless tobacco: Never  ?Vaping Use  ? Vaping Use: Never used  ?Substance and Sexual Activity  ? Alcohol use: No  ?  Alcohol/week: 0.0 standard drinks  ? Drug use: No  ? Sexual activity: Yes  ?  Birth control/protection: Surgical  ?Other Topics Concern  ? Not on file  ?Social History Narrative  ? Not on file  ? ?Social Determinants of Health  ? ?Financial Resource Strain: Not on file  ?Food Insecurity: Not on file  ?Transportation Needs: Not on file  ?Physical Activity: Not on file  ?Stress: Not on file  ?Social Connections: Not on file  ? ? ?Current Medications: ? ?Current Outpatient Medications:  ?  acetaminophen (TYLENOL) 325 MG tablet, Take 650 mg by mouth every 6 (six) hours as needed for moderate pain., Disp: , Rfl:  ?  atenolol (TENORMIN) 50 MG tablet, Take 50 mg by mouth 2 (two) times daily., Disp: , Rfl:  ?  fluconazole (DIFLUCAN) 150 MG tablet, Take 1 tablet (150 mg total) by mouth daily., Disp: 1 tablet, Rfl: 1 ?  furosemide (LASIX) 20 MG tablet, Take 20-40 mg by mouth daily., Disp: , Rfl:  ?  LamoTRIgine 100 MG TB24 24 hour tablet, Take 100 mg by mouth daily., Disp: , Rfl:  ?  traMADol (ULTRAM) 50 MG tablet, Take 1 tablet (50 mg total) by mouth every 6 (six) hours as needed., Disp: 20 tablet, Rfl: 0 ?  zolpidem (AMBIEN) 10 MG tablet, Take 10 mg by mouth at bedtime., Disp: , Rfl:  ? ?Review of Systems: ?Denies appetite changes, fevers, chills, fatigue, unexplained weight changes. ?Denies hearing loss, neck lumps or masses, mouth sores, ringing in ears or voice changes. ?Denies cough or wheezing.  Denies shortness of breath. ?Denies chest pain or palpitations. Denies leg swelling. ?Denies abdominal distention, pain, blood in stools, constipation, diarrhea, nausea, vomiting, or early satiety. ?Denies pain with intercourse, dysuria, frequency, hematuria or incontinence. ?Denies hot  flashes, pelvic pain, vaginal bleeding or vaginal discharge.   ?Denies joint pain, back pain or muscle pain/cramps. ?Denies itching, rash, or wounds. ?Denies dizziness, headaches, numbness or seizures. ?Denies swollen lymph nodes or glands, denies easy bruising or bleeding. ?Denies anxiety, depression, confusion, or decreased concentration. ? ?Physical Exam: ?BP (!) 152/88 (BP Location: Left Arm, Patient Position: Sitting)   Pulse 68   Temp 98 ?F (36.7 ?C) (Oral)   Resp 18   Ht 5' 4.5" (1.638 m)   Wt 212 lb (96.2 kg)   LMP 05/05/2013   BMI 35.83 kg/m?  ?General: Alert, oriented, no acute distress. ?HEENT: Normocephalic, atraumatic, sclera anicteric. ?Chest: Clear to auscultation bilaterally.  No wheezes or rhonchi. ?Cardiovascular: Regular rate and rhythm, no murmurs. ?Abdomen: Obese, soft, nontender.  Normoactive bowel sounds.  No masses or hepatosplenomegaly appreciated.   ?Extremities: Grossly normal range of motion.  Warm, well perfused.  No edema bilaterally. ?Skin: No rashes or lesions noted. ?Lymphatics: No cervical, supraclavicular, or inguinal adenopathy. ?GU: External genitalia is normal in appearance.  Slight prominence of her scar just lateral and superior to the clitoral hood on the right.  There is what appears to be a healed boil along the right lateral mons. After application of acetic acid, no acetowhite changes noted.  No atypical vascularity. ? ?Laboratory & Radiologic Studies: ?None new ? ?Assessment & Plan: ?[name] is a 41 y.o. 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 ?  ?Patient is doing well without evidence of cancer recurrence on her exam.  Has some scar tissue at the inferior aspect of her incision.  This is stable from her last visit. ? ?She has had resolution of candidiasis after treatment. ? ?We will continue with surveillance visits every 3 months solely at my office for the  time being.  We reviewed signs and symptoms that would be concerning for disease recurrence, the patient knows to call if she develops any of these between visits. ? ?28 minutes of total time was spent for this patient

## 2021-10-14 ENCOUNTER — Ambulatory Visit: Payer: Commercial Managed Care - PPO | Admitting: Gynecologic Oncology

## 2021-10-21 ENCOUNTER — Other Ambulatory Visit: Payer: Self-pay

## 2021-10-21 ENCOUNTER — Inpatient Hospital Stay: Payer: Commercial Managed Care - PPO | Attending: Gynecologic Oncology | Admitting: Gynecologic Oncology

## 2021-10-21 ENCOUNTER — Encounter: Payer: Self-pay | Admitting: Gynecologic Oncology

## 2021-10-21 VITALS — BP 145/83 | HR 68 | Temp 98.0°F | Resp 18 | Ht 64.0 in | Wt 211.5 lb

## 2021-10-21 DIAGNOSIS — C519 Malignant neoplasm of vulva, unspecified: Secondary | ICD-10-CM

## 2021-10-21 DIAGNOSIS — Z8544 Personal history of malignant neoplasm of other female genital organs: Secondary | ICD-10-CM | POA: Insufficient documentation

## 2021-10-21 DIAGNOSIS — B379 Candidiasis, unspecified: Secondary | ICD-10-CM

## 2021-10-21 DIAGNOSIS — N898 Other specified noninflammatory disorders of vagina: Secondary | ICD-10-CM | POA: Insufficient documentation

## 2021-10-21 DIAGNOSIS — Z9079 Acquired absence of other genital organ(s): Secondary | ICD-10-CM | POA: Diagnosis not present

## 2021-10-21 MED ORDER — FLUCONAZOLE 150 MG PO TABS: 150.0000 mg | ORAL_TABLET | Freq: Every day | ORAL | 0 refills | Status: AC

## 2021-10-21 NOTE — Progress Notes (Signed)
Gynecologic Oncology Return Clinic Visit  10/21/2021  Reason for Visit: surveillance in the setting of vulvar cancer  Treatment History: The patient presented with a 2-year history of a palpable lesion above her clitoris. 11/05/2020: Exophytic supra clitoral lesion excised in clinic.  Pathology from this noted a 1 x 1 x 0.5 cm lesion, biopsy noted to be superficial and assessment of invasion could not be completed. 11/19/2020: PET shows ill-defined hypermetabolic activity of the vulva.  Nodular cutaneous focus of abnormal hypermetabolic activity along the right anterior labia, nonspecific.  Hypermetabolic 1 cm right thyroid nodule, thyroid ultrasound recommended. 11/22/2020: Partial simple anterior vulvectomy, vulvar biopsies.  Findings at the time of surgery were an 8 x 8 mm area that was mildly erythematous and raised at the site of prior excision, stitch is still in place.  No surrounding induration noted.  2 superficial areas of mild skin discoloration on the right vulva were biopsied. 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  Right vulvar biopsies with no dysplasia or carcinoma, 1 showing ulcer with scale crust and the other acute inflammation with fibrosis. Discussed at tumor board. Given difficulty determining if any invasion, only microscopic focus on re-excision, and PET negative for metastatic disease, recommendation made for close surveillance.  Interval History: Reports overall doing well.  Is tired, has been busy at work.  Noticed several weeks ago having some swelling in and around her vagina.  Also thinks that she felt a "cyst" along the right inside part of her vagina.  Felt that this area was "engorged".  Has noted decrease swelling over the last couple of weeks.  Denies any associated vaginal bleeding or discharge.  One of the mons areas that lit up on the PET scan and has been discolored in the past is feeling  irritated with occasional pruritus again.  Past Medical/Surgical History: Past Medical History:  Diagnosis Date   Adrenal calcification ([facility])    Cancer ([place])    squamous cell carcinoma, vulvar ca 2022   COVID 10/10/2020   fever sore throat cough x 8-10-3 days rapid home test and work pcr done, pt does not have pcr results   Dysrhythmia    followed by PCP in [place]  years ago for palpatations and rate control   GERD (gastroesophageal reflux disease)    Headache    rare migraines   History of kidney stones    HTN (hypertension)    Hypothyroidism    Insomnia    Pneumonia    Seizures ([place])    no seizure episodes in "years"    Past Surgical History:  Procedure Laterality Date   ABDOMINAL HYSTERECTOMY     BILATERAL SALPINGECTOMY Bilateral 12/12/2013   Procedure: BILATERAL SALPINGECTOMY;  Surgeon: Margarette Asal, MD;  Location: [facility];  Service: Gynecology;  Laterality: Bilateral;   BREAST CYST EXCISION Left 06/17/2021   Procedure: CYST EXCISION LEFT BREAST;  Surgeon: Coralie Keens, MD;  Location: [facility];  Service: General;  Laterality: Left;   ESOPHAGOGASTRODUODENOSCOPY N/A 10/01/2015   Dr. Oneida Alar: normal esophagus, mild gastritis, duodenal diverticulum    LAPAROSCOPIC ASSISTED VAGINAL HYSTERECTOMY N/A 12/12/2013   uterine fibroids   LEEP  2004   VULVECTOMY N/A 11/22/2020   Procedure: WIDE EXCISION VULVECTOMY;  Surgeon: Lafonda Mosses, MD;  Location: [facility];  Service: Gynecology;  Laterality: N/A;    Family History  Problem Relation Age of Onset   Cancer Father  Unsure of type of cancer   Hypertension Father    Diabetes Father    Breast cancer Maternal Aunt 17   Stomach cancer Paternal Grandmother    Colon cancer Neg Hx    Endometrial cancer Neg Hx    Pancreatic cancer Neg Hx    Prostate cancer Neg Hx     Social History   Socioeconomic History   Marital status: Married    Spouse name: Not on file   Number  of children: Not on file   Years of education: Not on file   Highest education level: Not on file  Occupational History   Occupation: RN    Comment: [employer]   Tobacco Use   Smoking status: Former    Years: 10.00    Types: Cigarettes    Quit date: 2008    Years since quitting: 15.5   Smokeless tobacco: Never  Vaping Use   Vaping Use: Never used  Substance and Sexual Activity   Alcohol use: No    Alcohol/week: 0.0 standard drinks of alcohol   Drug use: No   Sexual activity: Yes    Birth control/protection: Surgical  Other Topics Concern   Not on file  Social History Narrative   Not on file   Social Determinants of Health   Financial Resource Strain: Not on file  Food Insecurity: Not on file  Transportation Needs: Not on file  Physical Activity: Not on file  Stress: Not on file  Social Connections: Not on file    Current Medications:  Current Outpatient Medications:    acetaminophen (TYLENOL) 325 MG tablet, Take 650 mg by mouth every 6 (six) hours as needed for moderate pain., Disp: , Rfl:    atenolol (TENORMIN) 50 MG tablet, Take 50 mg by mouth 2 (two) times daily., Disp: , Rfl:    fluconazole (DIFLUCAN) 150 MG tablet, Take 1 tablet (150 mg total) by mouth daily for 2 doses. Please take 1 tablet.  If no improvement in your symptoms after 48-72 hours, take a second tablet., Disp: 2 tablet, Rfl: 0   furosemide (LASIX) 20 MG tablet, Take 20-40 mg by mouth daily., Disp: , Rfl:    LamoTRIgine 100 MG TB24 24 hour tablet, Take 100 mg by mouth daily., Disp: , Rfl:    zolpidem (AMBIEN) 10 MG tablet, Take 10 mg by mouth at bedtime., Disp: , Rfl:   Review of Systems: Denies appetite changes, fevers, chills, fatigue, unexplained weight changes. Denies hearing loss, neck lumps or masses, mouth sores, ringing in ears or voice changes. Denies cough or wheezing.  Denies shortness of breath. Denies chest pain or palpitations. Denies leg swelling. Denies abdominal distention,  pain, blood in stools, constipation, diarrhea, nausea, vomiting, or early satiety. Denies pain with intercourse, dysuria, frequency, hematuria or incontinence. Denies hot flashes, pelvic pain, vaginal bleeding or vaginal discharge.   Denies joint pain, back pain or muscle pain/cramps. Denies itching, rash, or wounds. Denies dizziness, headaches, numbness or seizures. Denies swollen lymph nodes or glands, denies easy bruising or bleeding. Denies anxiety, depression, confusion, or decreased concentration.  Physical Exam: BP (!) 145/83 (BP Location: Left Arm, Patient Position: Sitting)   Pulse 68   Temp 98 F (36.7 C) (Tympanic)   Resp 18   Ht '5\' 4"'$  (1.626 m)   Wt 211 lb 8 oz (95.9 kg)   LMP 05/05/2013   SpO2 100%   BMI 36.30 kg/m  General: Alert, oriented, no acute distress. HEENT: Normocephalic, atraumatic, sclera anicteric. Chest: Unlabored  breathing on room air. Extremities: Grossly normal range of motion.  Warm, well perfused.  No edema bilaterally. Skin: No rashes or lesions noted. Lymphatics: No cervical, supraclavicular, or inguinal adenopathy. GU: External genitalia is normal in appearance.  Slight prominence of her scar just lateral and superior to the clitoral hood on the right, unchanged since her last visit.  Small discoloration of an area that has previously had a boil or evidence of folliculitis along the right aspect of her mons.  Some mild erythema of bilateral labia minora.  On speculum exam, no lesions seen within the vagina, some thick white discharge noted.  No masses or nodularity appreciated along the vagina or labia.  Laboratory & Radiologic Studies: None new  Assessment & Plan: [name] is a 41 y.o. 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   Patient is doing well without evidence of cancer recurrence on her exam.  Continues to have some scar tissue at the  inferior aspect of her incision.  This is stable from her last visit.  Also has some evidence of what I suspect is folliculitis along the right lateral mons, where she has had similar symptoms before.  This area lit up on PET scan prior to surgery.  Biopsy at that time showed no malignancy.  Given him symptoms as well as erythema on exam and discharge, I suspect the patient has Candida which is causing her symptoms.  I sent prescription for Diflucan to her pharmacy.  I have asked her to call if her symptoms do not improve with treatment.   We will continue with surveillance visits every 3 months solely at my office for the time being.  We reviewed signs and symptoms that would be concerning for disease recurrence, the patient knows to call if she develops any of these between visits.  22 minutes of total time was spent for this patient encounter, including preparation, face-to-face counseling with the patient and coordination of care, and documentation of the encounter.  Jeral Pinch, MD  Division of Gynecologic Oncology  Department of Obstetrics and Gynecology  [facility]

## 2021-10-21 NOTE — Patient Instructions (Addendum)
It was good to see you today.  I do not see or feel any evidence of cancer recurrence.  I will see you back in 3 months.    I am sending these medication to your pharmacy.  Please let me know if your symptoms do not improve after taking it.  If you develop any new and concerning symptoms before your next visit, please call to see me sooner.

## 2022-01-03 NOTE — Progress Notes (Signed)
Appt cancelled

## 2022-01-06 IMAGING — US US THYROID
1 series · 12 of 25 positions shown · non-contrast
Comparison: PET-CT, 11/19/2020

CLINICAL DATA: Nontoxic thyroid nodule

EXAM:
THYROID ULTRASOUND
TECHNIQUE: Ultrasound examination of the thyroid gland and adjacent soft
tissues was performed.

[Series 1: us thyroid · 12 of 57 slices shown]
[im 3/57]
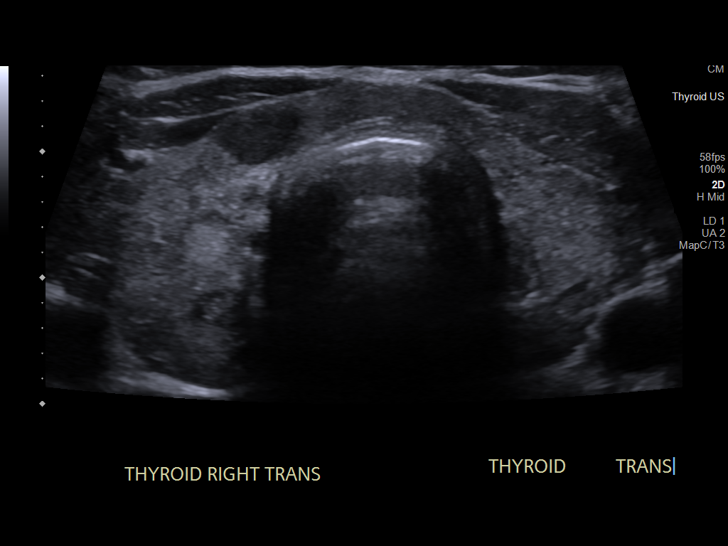
[im 8/57]
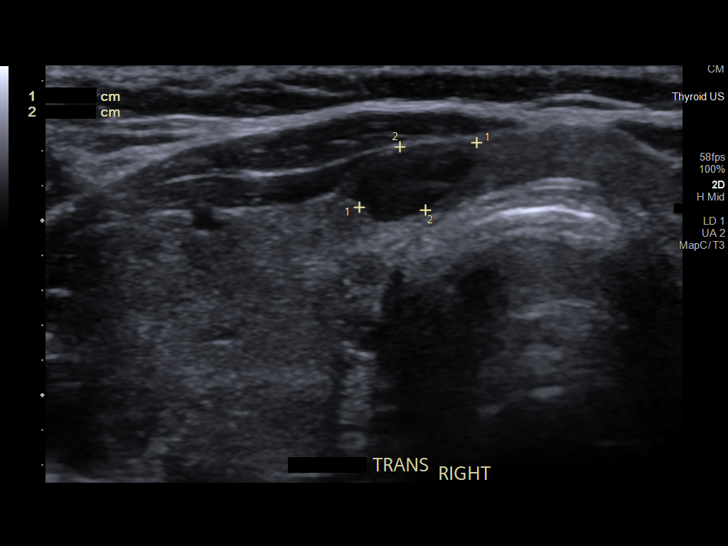
[im 12/57]
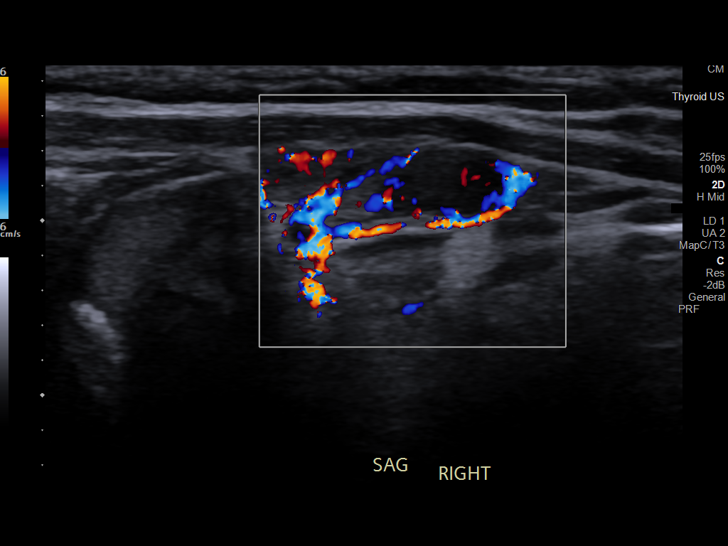
[im 17/57]
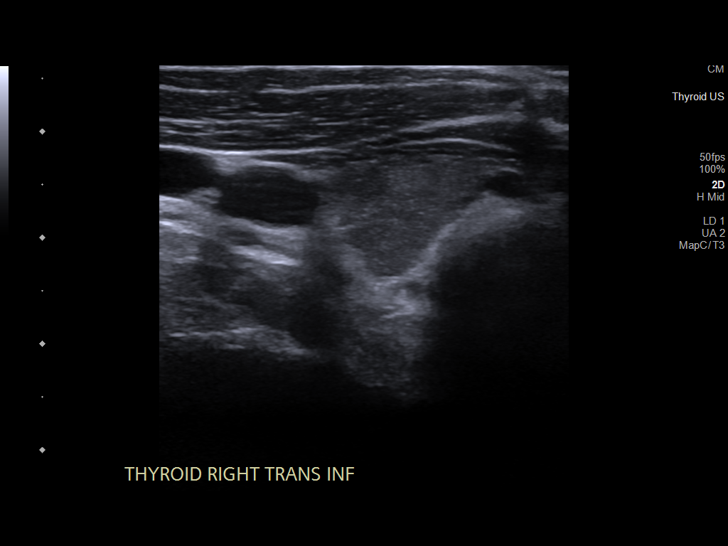
[im 22/57]
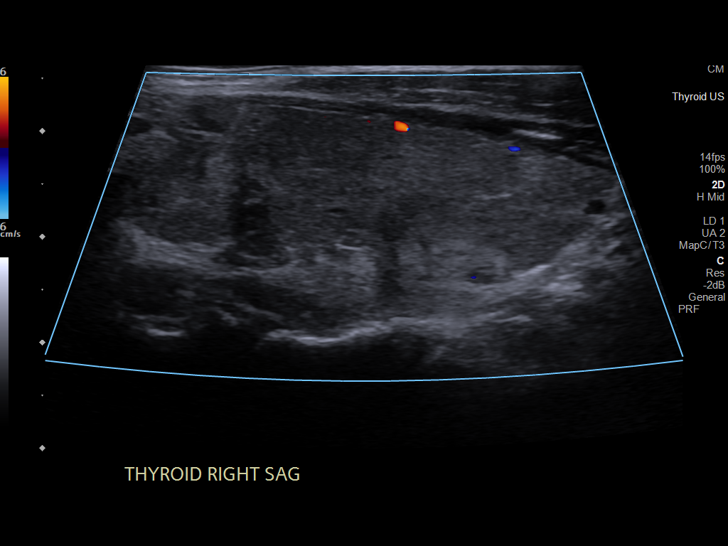
[im 26/57]
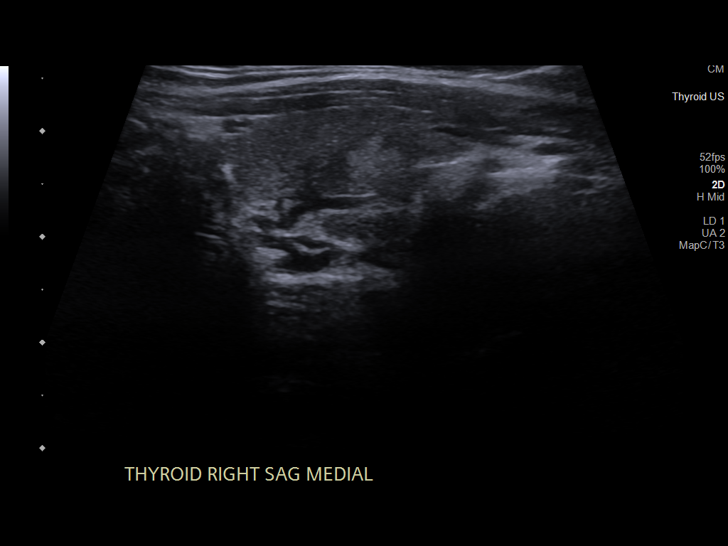
[im 31/57]
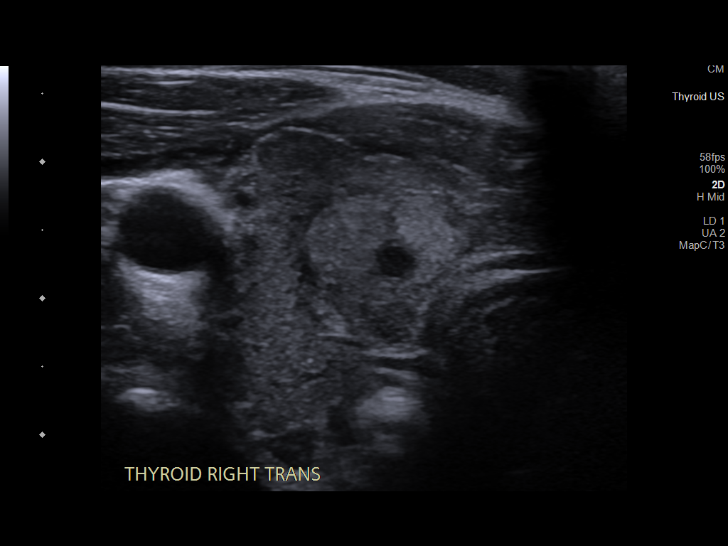
[im 36/57]
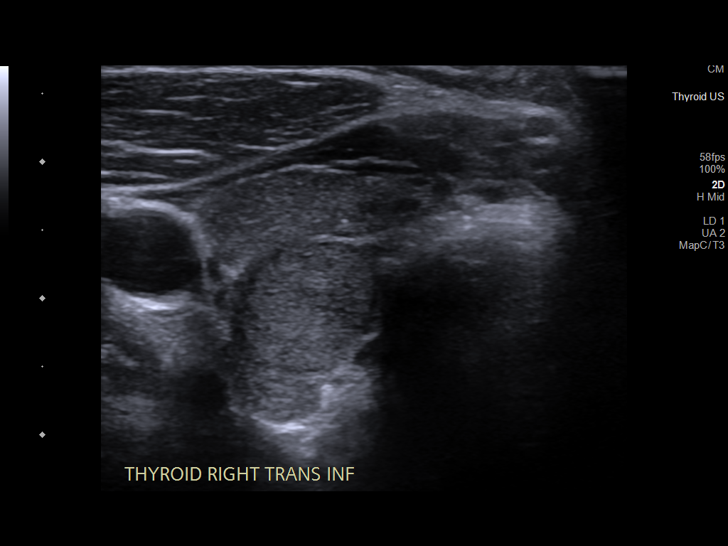
[im 40/57]
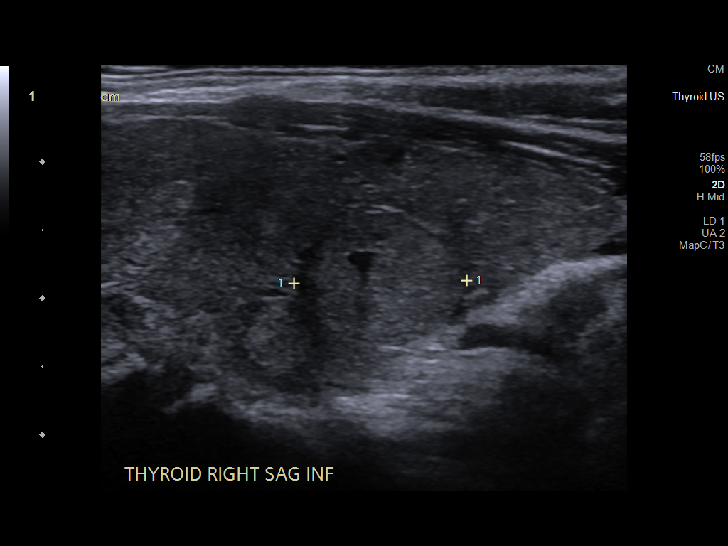
[im 45/57]
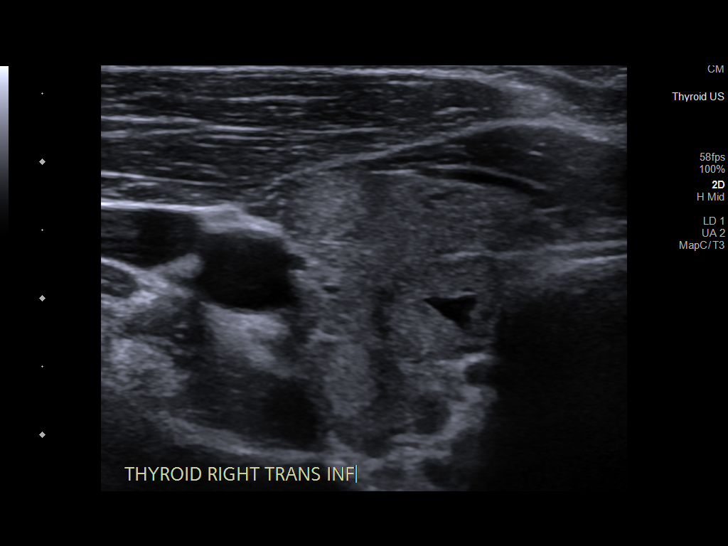
[im 50/57]
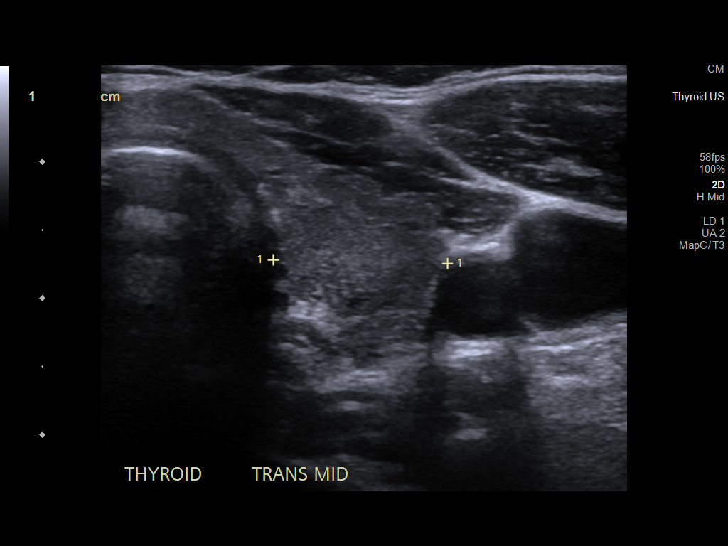
[im 54/57]
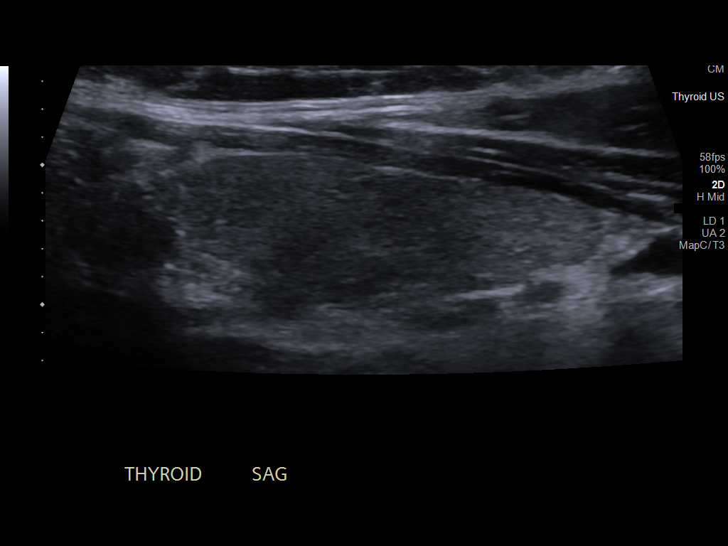

[12 of 25 positions shown; findings below may reference images not displayed]

FINDINGS: Parenchymal Echotexture: Mildly heterogenous

Isthmus: 0.3 cm

Right lobe: 5.0 x 2.0 x 1.9 cm (volume = 9.9 cm^3)

Left lobe: 3.5 x 1.1 x 1.3 cm (volume = 2.6 cm^3)

The thyroid is within normal limits for age in size.

No appreciable nodules within the LEFT thyroid gland. RIGHT thyroid
gland nodules, as below.

_________________________________________________________

Estimated total number of nodules >/= 1 cm: 2

Number of spongiform nodules >/=  2 cm not described below (TR1): 0

Number of mixed cystic and solid nodules >/= 1.5 cm not described
below (TR2): 0

Nodule # 1:

Location: Isthmus; Superior

Maximum size: 0.8 cm; Other 2 dimensions: 0.4 x 0.8 cm

Composition: solid/almost completely solid (2)

Echogenicity: isoechoic (1)

Shape: not taller-than-wide (0)

Margins: smooth (0)

Echogenic foci: none (0)

ACR TI-RADS total points: 3.

ACR TI-RADS risk category: TR3 (3 points).

ACR TI-RADS recommendations:

Given size (<1.4 cm) and appearance, this nodule does NOT meet
TI-RADS criteria for biopsy or dedicated follow-up.

_________________________________________________________

Nodule # 2:

Location: Right; Mid

Maximum size: 1.4 cm; Other 2 dimensions: 0.9 x 1.3 cm

Composition: mixed cystic and solid (1)

Echogenicity: hyperechoic (1)

Shape: not taller-than-wide (0)

Margins: smooth (0)

Echogenic foci: none (0)

ACR TI-RADS total points: 2.

ACR TI-RADS risk category: TR2 (2 points).

ACR TI-RADS recommendations:

This nodule does NOT meet TI-RADS criteria for biopsy or dedicated
follow-up.

_________________________________________________________

Nodule # 3:

Location: Right; Inferior

Maximum size: 1.3 cm; Other 2 dimensions: 0.9 x 1.1 cm

Composition: solid/almost completely solid (2)

Echogenicity: isoechoic (1)

Shape: not taller-than-wide (0)

Margins: smooth (0)

Echogenic foci: none (0)

ACR TI-RADS total points: 3.

ACR TI-RADS risk category: TR3 (3 points).

ACR TI-RADS recommendations:

Given size (<1.4 cm) and appearance, this nodule does NOT meet
TI-RADS criteria for biopsy or dedicated follow-up.

_________________________________________________________

Nodule # 4:

Location: Right; Mid

Maximum size: 0.7 cm; Other 2 dimensions: 0.6 x 0.6 cm

Composition: solid/almost completely solid (2)

Echogenicity: hyperechoic (1)

Shape: not taller-than-wide (0)

Margins: smooth (0)

Echogenic foci: none (0)

ACR TI-RADS total points: 3.

ACR TI-RADS risk category: TR3 (3 points).

ACR TI-RADS recommendations:

Given size (<1.4 cm) and appearance, this nodule does NOT meet
TI-RADS criteria for biopsy or dedicated follow-up.

_________________________________________________________

Subcentimeter, benign-appearing lymph nodes are seen within the
thyroid gland.
IMPRESSION: Nonenlarged, multinodular RIGHT thyroid gland without any nodule
meeting criteria for follow-up or biopsy, as above.

No lymphadenopathy within the imaged neck.

The above is in keeping with the ACR TI-RADS recommendations - [HOSPITAL] 4691;[DATE].

## 2022-01-21 ENCOUNTER — Other Ambulatory Visit: Payer: Self-pay | Admitting: Obstetrics and Gynecology

## 2022-01-21 DIAGNOSIS — Z1231 Encounter for screening mammogram for malignant neoplasm of breast: Secondary | ICD-10-CM

## 2022-01-24 ENCOUNTER — Ambulatory Visit: Payer: Commercial Managed Care - PPO | Admitting: Gynecologic Oncology

## 2022-02-11 ENCOUNTER — Encounter: Payer: Self-pay | Admitting: Gynecologic Oncology

## 2022-02-11 ENCOUNTER — Inpatient Hospital Stay: Payer: Commercial Managed Care - PPO | Attending: Gynecologic Oncology | Admitting: Gynecologic Oncology

## 2022-02-11 ENCOUNTER — Other Ambulatory Visit: Payer: Self-pay

## 2022-02-11 VITALS — BP 151/90 | HR 60 | Resp 16 | Ht 64.0 in | Wt 212.3 lb

## 2022-02-11 DIAGNOSIS — Z8544 Personal history of malignant neoplasm of other female genital organs: Secondary | ICD-10-CM | POA: Diagnosis not present

## 2022-02-11 DIAGNOSIS — B3732 Chronic candidiasis of vulva and vagina: Secondary | ICD-10-CM

## 2022-02-11 DIAGNOSIS — C519 Malignant neoplasm of vulva, unspecified: Secondary | ICD-10-CM

## 2022-02-11 DIAGNOSIS — B379 Candidiasis, unspecified: Secondary | ICD-10-CM

## 2022-02-11 MED ORDER — FLUCONAZOLE 150 MG PO TABS: 150.0000 mg | ORAL_TABLET | Freq: Every day | ORAL | 2 refills | Status: DC

## 2022-02-11 NOTE — Patient Instructions (Signed)
It was good to see you today.  I do not see any evidence of recurrent cancer.  I sent a prescription in for yeast medication.  Next time you have symptoms, please take 1 dose of this.  If no improvement in your discharge within a couple of days, take another dose 48-72 hours after the first one.  We also discussed trying boric acid vaginal suppositories to see if this helps equalize the flora in your vagina and help prevent the discharge you have been having.  I will see you in 3 months.  Please call if you develop any new or concerning symptoms or see any new lesions between now and your follow-up visit.

## 2022-02-11 NOTE — Progress Notes (Signed)
Gynecologic Oncology Return Clinic Visit  02/11/22  Reason for Visit: surveillance in the setting of vulvar cancer  Treatment History: The patient presented with a 2-year history of a palpable lesion above her clitoris. 11/05/2020: Exophytic supra clitoral lesion excised in clinic.  Pathology from this noted a 1 x 1 x 0.5 cm lesion, biopsy noted to be superficial and assessment of invasion could not be completed. 11/19/2020: PET shows ill-defined hypermetabolic activity of the vulva.  Nodular cutaneous focus of abnormal hypermetabolic activity along the right anterior labia, nonspecific.  Hypermetabolic 1 cm right thyroid nodule, thyroid ultrasound recommended. 11/22/2020: Partial simple anterior vulvectomy, vulvar biopsies.  Findings at the time of surgery were an 8 x 8 mm area that was mildly erythematous and raised at the site of prior excision, stitch is still in place.  No surrounding induration noted.  2 superficial areas of mild skin discoloration on the right vulva were biopsied. 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  Right vulvar biopsies with no dysplasia or carcinoma, 1 showing ulcer with scale crust and the other acute inflammation with fibrosis. Discussed at tumor board. Given difficulty determining if any invasion, only microscopic focus on re-excision, and PET negative for metastatic disease, recommendation made for close surveillance.  Interval History: Doing well.  Continues to endorse having thick white discharge intermittently.  She will occasionally have pain at the inferior aspect of her incision and sometimes this area feels mildly inflamed.  Has not seen any lesions.  Denies any pruritus.  Denies any bleeding.  Past Medical/Surgical History: Past Medical History:  Diagnosis Date   Adrenal calcification (HCC)    Cancer ([place])    squamous cell carcinoma, vulvar ca 2022   COVID 10/10/2020   fever sore  throat cough x 8-10-3 days rapid home test and work pcr done, pt does not have pcr results   Dysrhythmia    followed by PCP in [place]  years ago for palpatations and rate control   GERD (gastroesophageal reflux disease)    Headache    rare migraines   History of kidney stones    HTN (hypertension)    Hypothyroidism    Insomnia    Pneumonia    Seizures ([place])    no seizure episodes in "years"    Past Surgical History:  Procedure Laterality Date   ABDOMINAL HYSTERECTOMY     BILATERAL SALPINGECTOMY Bilateral 12/12/2013   Procedure: BILATERAL SALPINGECTOMY;  Surgeon: Margarette Asal, MD;  Location: [facility];  Service: Gynecology;  Laterality: Bilateral;   BREAST CYST EXCISION Left 06/17/2021   Procedure: CYST EXCISION LEFT BREAST;  Surgeon: Coralie Keens, MD;  Location: [facility];  Service: General;  Laterality: Left;   ESOPHAGOGASTRODUODENOSCOPY N/A 10/01/2015   Dr. Oneida Alar: normal esophagus, mild gastritis, duodenal diverticulum    LAPAROSCOPIC ASSISTED VAGINAL HYSTERECTOMY N/A 12/12/2013   uterine fibroids   LEEP  2004   VULVECTOMY N/A 11/22/2020   Procedure: WIDE EXCISION VULVECTOMY;  Surgeon: Lafonda Mosses, MD;  Location: [facility];  Service: Gynecology;  Laterality: N/A;    Family History  Problem Relation Age of Onset   Cancer Father        Unsure of type of cancer   Hypertension Father    Diabetes Father    Breast cancer Maternal Aunt 76   Stomach cancer Paternal Grandmother    Colon cancer Neg Hx    Endometrial cancer Neg Hx  Pancreatic cancer Neg Hx    Prostate cancer Neg Hx     Social History   Socioeconomic History   Marital status: Married    Spouse name: Not on file   Number of children: Not on file   Years of education: Not on file   Highest education level: Not on file  Occupational History   Occupation: RN    Comment: [employer]   Tobacco Use   Smoking status: Former    Years: 10.00    Types:  Cigarettes    Quit date: 2008    Years since quitting: 15.8   Smokeless tobacco: Never  Vaping Use   Vaping Use: Never used  Substance and Sexual Activity   Alcohol use: No    Alcohol/week: 0.0 standard drinks of alcohol   Drug use: No   Sexual activity: Yes    Birth control/protection: Surgical  Other Topics Concern   Not on file  Social History Narrative   Not on file   Social Determinants of Health   Financial Resource Strain: Not on file  Food Insecurity: Not on file  Transportation Needs: Not on file  Physical Activity: Not on file  Stress: Not on file  Social Connections: Not on file    Current Medications:  Current Outpatient Medications:    fluconazole (DIFLUCAN) 150 MG tablet, Take 1 tablet (150 mg total) by mouth daily. Take 1 tablet.  If no significant improvement in symptoms, take a second tablet 48 hours later., Disp: 2 tablet, Rfl: 2   acetaminophen (TYLENOL) 325 MG tablet, Take 650 mg by mouth every 6 (six) hours as needed for moderate pain., Disp: , Rfl:    atenolol (TENORMIN) 50 MG tablet, Take 50 mg by mouth 2 (two) times daily., Disp: , Rfl:    furosemide (LASIX) 20 MG tablet, Take 20-40 mg by mouth daily., Disp: , Rfl:    LamoTRIgine 100 MG TB24 24 hour tablet, Take 100 mg by mouth daily., Disp: , Rfl:    zolpidem (AMBIEN) 10 MG tablet, Take 10 mg by mouth at bedtime., Disp: , Rfl:   Review of Systems: Denies appetite changes, fevers, chills, fatigue, unexplained weight changes. Denies hearing loss, neck lumps or masses, mouth sores, ringing in ears or voice changes. Denies cough or wheezing.  Denies shortness of breath. Denies chest pain or palpitations. Denies leg swelling. Denies abdominal distention, pain, blood in stools, constipation, diarrhea, nausea, vomiting, or early satiety. Denies pain with intercourse, dysuria, frequency, hematuria or incontinence. Denies hot flashes, pelvic pain, vaginal bleeding or vaginal discharge.   Denies joint pain,  back pain or muscle pain/cramps. Denies itching, rash, or wounds. Denies dizziness, headaches, numbness or seizures. Denies swollen lymph nodes or glands, denies easy bruising or bleeding. Denies anxiety, depression, confusion, or decreased concentration.  Physical Exam: BP (!) 151/90 (BP Location: Left Arm, Patient Position: Sitting)   Pulse 60   Resp 16   Ht '5\' 4"'$  (1.626 m)   Wt 212 lb 5 oz (96.3 kg)   LMP 05/05/2013   SpO2 100%   BMI 36.44 kg/m  General: Alert, oriented, no acute distress. HEENT: Normocephalic, atraumatic, sclera anicteric. Chest: Clear to auscultation bilaterally.  No wheezes or rhonchi. Cardiovascular: Regular rate and rhythm, no murmurs. Abdomen: Obese, soft, nontender.  Normoactive bowel sounds.  No masses or hepatosplenomegaly appreciated.   Extremities: Grossly normal range of motion.  Warm, well perfused.  No edema bilaterally. Skin: No rashes or lesions noted. Lymphatics: No cervical, supraclavicular, or inguinal  adenopathy. GU: Vulvosvopy was performed.  Prominent scar but no associated acetowhite areas after application of 5% acetic acid.  Several areas that appeared consistent with either ingrown hairs or folliculitis.  No atypical vascularity.  Laboratory & Radiologic Studies: None new  Assessment & Plan: [name] is a 41 y.o. 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   Patient is doing well without evidence of cancer recurrence on her exam.    Given intermittent symptomatic thick white discharge, suspect that she may be having recurrent yeast infections.  Prescription sent for Diflucan with 2 dose regimen and several refills.  Also encouraged her to try boric acid suppositories.   We will continue with surveillance visits every 3 months solely at my office for the time being.  We reviewed signs and symptoms that would be concerning for disease  recurrence, the patient knows to call if she develops any of these between visits.  22 minutes of total time was spent for this patient encounter, including preparation, face-to-face counseling with the patient and coordination of care, and documentation of the encounter.  Jeral Pinch, MD  Division of Gynecologic Oncology  Department of Obstetrics and Gynecology  [facility]

## 2022-02-14 ENCOUNTER — Ambulatory Visit
Admission: RE | Admit: 2022-02-14 | Discharge: 2022-02-14 | Disposition: A | Discharge status: Home / Self Care | Payer: Commercial Managed Care - PPO | Source: Ambulatory Visit | Attending: Obstetrics and Gynecology | Admitting: Obstetrics and Gynecology

## 2022-02-14 DIAGNOSIS — Z1231 Encounter for screening mammogram for malignant neoplasm of breast: Secondary | ICD-10-CM

## 2022-04-18 ENCOUNTER — Telehealth: Payer: Self-pay | Admitting: *Deleted

## 2022-04-18 NOTE — Telephone Encounter (Signed)
LMOM for the patient to call the office back. Patient needs a follow up appt with Dr Berline Lopes in Jan or Feb

## 2022-04-23 NOTE — Telephone Encounter (Signed)
LMOM for the patient to call the office back. Patient needs a follow up appt with Dr Berline Lopes in Jan or Feb

## 2022-04-25 NOTE — Telephone Encounter (Signed)
LMOM for the patient to call the office back. Patient needs a follow up appt with Dr Berline Lopes in Jan or Feb

## 2022-06-10 ENCOUNTER — Encounter: Payer: Self-pay | Admitting: Gynecologic Oncology

## 2022-06-12 ENCOUNTER — Encounter: Payer: Self-pay | Admitting: Gynecologic Oncology

## 2022-06-12 ENCOUNTER — Other Ambulatory Visit: Payer: Self-pay

## 2022-06-12 ENCOUNTER — Inpatient Hospital Stay: Payer: BC Managed Care – PPO | Attending: Gynecologic Oncology | Admitting: Gynecologic Oncology

## 2022-06-12 VITALS — BP 177/94 | HR 72 | Temp 97.7°F | Resp 14 | Wt 217.0 lb

## 2022-06-12 DIAGNOSIS — M25551 Pain in right hip: Secondary | ICD-10-CM | POA: Insufficient documentation

## 2022-06-12 DIAGNOSIS — M25552 Pain in left hip: Secondary | ICD-10-CM | POA: Diagnosis not present

## 2022-06-12 DIAGNOSIS — R102 Pelvic and perineal pain: Secondary | ICD-10-CM | POA: Insufficient documentation

## 2022-06-12 DIAGNOSIS — Z8544 Personal history of malignant neoplasm of other female genital organs: Secondary | ICD-10-CM | POA: Diagnosis present

## 2022-06-12 DIAGNOSIS — L739 Follicular disorder, unspecified: Secondary | ICD-10-CM | POA: Insufficient documentation

## 2022-06-12 DIAGNOSIS — C519 Malignant neoplasm of vulva, unspecified: Secondary | ICD-10-CM

## 2022-06-12 NOTE — Patient Instructions (Addendum)
It was good to see you today.  I do not see or feel any evidence of cancer recurrence on your exam.  I ordered a CT scan to take a look at your pelvis and your bones.  I think we will need to do this before any additional imaging such as an MRI.  Please reach out to your primary care provider.  If CT scan is negative, it may be worthwhile having you see a physical therapist.  I will see you for follow-up in 3 months.  As always, if you develop any new and concerning symptoms before your next visit, please call to see me sooner.

## 2022-06-12 NOTE — Progress Notes (Signed)
Gynecologic Oncology Return Clinic Visit  06/12/22  Reason for Visit:  surveillance in the setting of vulvar cancer   Treatment History: The patient presented with a 2-year history of a palpable lesion above her clitoris. 11/05/2020: Exophytic supra clitoral lesion excised in clinic.  Pathology from this noted a 1 x 1 x 0.5 cm lesion, biopsy noted to be superficial and assessment of invasion could not be completed. 11/19/2020: PET shows ill-defined hypermetabolic activity of the vulva.  Nodular cutaneous focus of abnormal hypermetabolic activity along the right anterior labia, nonspecific.  Hypermetabolic 1 cm right thyroid nodule, thyroid ultrasound recommended. 11/22/2020: Partial simple anterior vulvectomy, vulvar biopsies.  Findings at the time of surgery were an 8 x 8 mm area that was mildly erythematous and raised at the site of prior excision, stitch is still in place.  No surrounding induration noted.  2 superficial areas of mild skin discoloration on the right vulva were biopsied. 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  Right vulvar biopsies with no dysplasia or carcinoma, 1 showing ulcer with scale crust and the other acute inflammation with fibrosis. Discussed at tumor board. Given difficulty determining if any invasion, only microscopic focus on re-excision, and PET negative for metastatic disease, recommendation made for close surveillance.  Interval History: Patient reports overall doing well although about 2 weeks ago developed lower pelvic pain as well as pain that feels like it is deep in her bilateral hip joints and buttocks.  She describes this pain as burning like things are "on fire".  Nothing seems to make this pain better or worse.  She recently had some bright red bleeding with bowel movement.  Notes that bowel function has been normal.  She denies any urinary symptoms.  Denies any vulvar symptoms  including itching, pain, bleeding, or discharge.  Thinks she may have had a ingrown hair on her left vulva.  Past Medical/Surgical History: Past Medical History:  Diagnosis Date   Adrenal calcification (HCC)    Cancer ([place])    squamous cell carcinoma, vulvar ca 2022   COVID 10/10/2020   fever sore throat cough x 8-10-3 days rapid home test and work pcr done, pt does not have pcr results   Dysrhythmia    followed by PCP in [place]  years ago for palpatations and rate control   GERD (gastroesophageal reflux disease)    Headache    rare migraines   History of kidney stones    HTN (hypertension)    Hypothyroidism    Insomnia    Pneumonia    Seizures ([place])    no seizure episodes in "years"    Past Surgical History:  Procedure Laterality Date   ABDOMINAL HYSTERECTOMY     BILATERAL SALPINGECTOMY Bilateral 12/12/2013   Procedure: BILATERAL SALPINGECTOMY;  Surgeon: Margarette Asal, MD;  Location: [facility];  Service: Gynecology;  Laterality: Bilateral;   BREAST CYST EXCISION Left 06/17/2021   Procedure: CYST EXCISION LEFT BREAST;  Surgeon: Coralie Keens, MD;  Location: [facility];  Service: General;  Laterality: Left;   ESOPHAGOGASTRODUODENOSCOPY N/A 10/01/2015   Dr. Oneida Alar: normal esophagus, mild gastritis, duodenal diverticulum    LAPAROSCOPIC ASSISTED VAGINAL HYSTERECTOMY N/A 12/12/2013   uterine fibroids   LEEP  2004   VULVECTOMY N/A 11/22/2020   Procedure: WIDE EXCISION VULVECTOMY;  Surgeon: Lafonda Mosses, MD;  Location: [facility];  Service: Gynecology;  Laterality: N/A;    Family History  Problem Relation  Age of Onset   Cancer Father        Unsure of type of cancer   Hypertension Father    Diabetes Father    Breast cancer Maternal Aunt 24   Stomach cancer Paternal Grandmother    Colon cancer Neg Hx    Endometrial cancer Neg Hx    Pancreatic cancer Neg Hx    Prostate cancer Neg Hx     Social History   Socioeconomic History    Marital status: Married    Spouse name: Not on file   Number of children: Not on file   Years of education: Not on file   Highest education level: Not on file  Occupational History   Occupation: RN    Comment: [employer]   Tobacco Use   Smoking status: Former    Years: 10.00    Types: Cigarettes    Quit date: 2008    Years since quitting: 16.1   Smokeless tobacco: Never  Vaping Use   Vaping Use: Never used  Substance and Sexual Activity   Alcohol use: No    Alcohol/week: 0.0 standard drinks of alcohol   Drug use: No   Sexual activity: Yes    Birth control/protection: Surgical  Other Topics Concern   Not on file  Social History Narrative   Not on file   Social Determinants of Health   Financial Resource Strain: Not on file  Food Insecurity: Not on file  Transportation Needs: Not on file  Physical Activity: Not on file  Stress: Not on file  Social Connections: Not on file    Current Medications:  Current Outpatient Medications:    acetaminophen (TYLENOL) 325 MG tablet, Take 650 mg by mouth every 6 (six) hours as needed for moderate pain., Disp: , Rfl:    atenolol (TENORMIN) 50 MG tablet, Take 50 mg by mouth 2 (two) times daily., Disp: , Rfl:    furosemide (LASIX) 20 MG tablet, Take 20-40 mg by mouth daily., Disp: , Rfl:    LamoTRIgine 100 MG TB24 24 hour tablet, Take 100 mg by mouth daily., Disp: , Rfl:    zolpidem (AMBIEN) 10 MG tablet, Take 10 mg by mouth at bedtime., Disp: , Rfl:   Review of Systems: + pelvic pain, back pain Denies appetite changes, fevers, chills, fatigue, unexplained weight changes. Denies hearing loss, neck lumps or masses, mouth sores, ringing in ears or voice changes. Denies cough or wheezing.  Denies shortness of breath. Denies chest pain or palpitations. Denies leg swelling. Denies abdominal distention, pain, blood in stools, constipation, diarrhea, nausea, vomiting, or early satiety. Denies pain with intercourse, dysuria,  frequency, hematuria or incontinence. Denies hot flashes, pelvic pain, vaginal bleeding or vaginal discharge.   Denies joint pain, back pain or muscle pain/cramps. Denies itching, rash, or wounds. Denies dizziness, headaches, numbness or seizures. Denies swollen lymph nodes or glands, denies easy bruising or bleeding. Denies anxiety, depression, confusion, or decreased concentration.  Physical Exam: BP (!) 177/94 (BP Location: Left Arm, Patient Position: Sitting) Comment: informed Dr Berline Lopes; she is working with neurologist to get the BP under control.  Pulse 72   Temp 97.7 F (36.5 C) (Oral)   Resp 14   Wt 217 lb (98.4 kg)   LMP 05/05/2013   SpO2 100%   BMI 37.25 kg/m  General: Alert, oriented, no acute distress. HEENT: Normocephalic, atraumatic, sclera anicteric. Chest: Clear to auscultation bilaterally.  No wheezes or rhonchi. Cardiovascular: Regular rate and rhythm, no murmurs. Abdomen: Obese, soft,  nontender.  Normoactive bowel sounds.  No masses or hepatosplenomegaly appreciated.   Extremities: Grossly normal range of motion.  Warm, well perfused.  No edema bilaterally. Skin: No rashes or lesions noted. Lymphatics: No cervical, supraclavicular, or inguinal adenopathy. GU: Vulvosvopy was performed.  Prominent scar but no associated acetowhite areas after application of 5% acetic acid.  Several areas that appeared consistent with either ingrown hairs or folliculitis.  No atypical vascularity.  Bimanual exam, no masses or nodularity appreciated within the pelvis.  On speculum exam, vaginal tissue is normal in appearance without masses.  Large hemorrhoid noted.  Laboratory & Radiologic Studies: None new  Assessment & Plan: [name] is a 42 y.o. 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 Surgical excision was in 11/2020.   Patient is without evidence of cancer recurrence on her exam.      We will continue with surveillance visits every 3 months solely at my office for the time being.  We reviewed signs and symptoms that would be concerning for disease recurrence, the patient knows to call if she develops any of these between visits.  We discussed her hip and pelvic symptoms.  I think she would benefit from an MRI, but I am worried this will be covered by insurance given no previous imaging.  I will plan to place an order for a CT of the pelvis.  I have encouraged her to reach out to her primary care provider.  22 minutes of total time was spent for this patient encounter, including preparation, face-to-face counseling with the patient and coordination of care, and documentation of the encounter.  Jeral Pinch, MD  Division of Gynecologic Oncology  Department of Obstetrics and Gynecology  [facility]

## 2022-06-16 ENCOUNTER — Encounter: Payer: Self-pay | Admitting: Gynecologic Oncology

## 2022-06-17 ENCOUNTER — Ambulatory Visit (HOSPITAL_COMMUNITY)
Admission: RE | Admit: 2022-06-17 | Discharge: 2022-06-17 | Disposition: A | Discharge status: Home / Self Care | Payer: BC Managed Care – PPO | Source: Ambulatory Visit | Attending: Gynecologic Oncology | Admitting: Gynecologic Oncology

## 2022-06-17 ENCOUNTER — Telehealth: Payer: Self-pay | Admitting: *Deleted

## 2022-06-17 ENCOUNTER — Ambulatory Visit (HOSPITAL_COMMUNITY): Payer: BC Managed Care – PPO

## 2022-06-17 DIAGNOSIS — M25552 Pain in left hip: Secondary | ICD-10-CM | POA: Diagnosis present

## 2022-06-17 DIAGNOSIS — R102 Pelvic and perineal pain: Secondary | ICD-10-CM | POA: Insufficient documentation

## 2022-06-17 DIAGNOSIS — M25551 Pain in right hip: Secondary | ICD-10-CM | POA: Diagnosis present

## 2022-06-17 MED ORDER — IOHEXOL 300 MG/ML  SOLN
100.0000 mL | Freq: Once | INTRAMUSCULAR | Status: AC | PRN
  Administered 2022-06-17: 80 mL via INTRAVENOUS

## 2022-06-17 NOTE — Telephone Encounter (Signed)
Patient's insurance as authorized the Ct scan and scan has been scheduled for today at 5:30 pm

## 2022-09-11 ENCOUNTER — Telehealth: Payer: Self-pay | Admitting: Surgery

## 2022-09-11 NOTE — Telephone Encounter (Signed)
Pt called to reschedule her visit from 5/24 to 7/11 at 2:30pm. Patient verbalized understanding and had no concerns at this time.

## 2022-09-12 ENCOUNTER — Inpatient Hospital Stay: Payer: BC Managed Care – PPO | Admitting: Gynecologic Oncology

## 2022-09-12 DIAGNOSIS — C519 Malignant neoplasm of vulva, unspecified: Secondary | ICD-10-CM

## 2022-10-30 ENCOUNTER — Inpatient Hospital Stay: Payer: BC Managed Care – PPO | Attending: Gynecologic Oncology | Admitting: Gynecologic Oncology

## 2022-10-30 ENCOUNTER — Encounter: Payer: Self-pay | Admitting: Gynecologic Oncology

## 2022-10-30 VITALS — BP 168/90 | HR 65 | Temp 98.4°F | Resp 17 | Ht 64.0 in | Wt 219.0 lb

## 2022-10-30 DIAGNOSIS — Z8544 Personal history of malignant neoplasm of other female genital organs: Secondary | ICD-10-CM | POA: Diagnosis present

## 2022-10-30 DIAGNOSIS — Z9079 Acquired absence of other genital organ(s): Secondary | ICD-10-CM | POA: Diagnosis not present

## 2022-10-30 DIAGNOSIS — I10 Essential (primary) hypertension: Secondary | ICD-10-CM | POA: Diagnosis not present

## 2022-10-30 DIAGNOSIS — C519 Malignant neoplasm of vulva, unspecified: Secondary | ICD-10-CM

## 2022-10-30 NOTE — Patient Instructions (Signed)
It was good to see you today.  I do not see or feel any evidence of cancer recurrence on your exam.  I will see you for follow-up in 6 months.  As always, if you develop any new and concerning symptoms before your next visit, please call to see me sooner.   

## 2022-10-30 NOTE — Progress Notes (Signed)
Gynecologic Oncology Return Clinic Visit  10/30/22  Reason for Visit:  surveillance in the setting of vulvar cancer   Treatment History: The patient presented with a 2-year history of a palpable lesion above her clitoris. 11/05/2020: Exophytic supra clitoral lesion excised in clinic.  Pathology from this noted a 1 x 1 x 0.5 cm lesion, biopsy noted to be superficial and assessment of invasion could not be completed. 11/19/2020: PET shows ill-defined hypermetabolic activity of the vulva.  Nodular cutaneous focus of abnormal hypermetabolic activity along the right anterior labia, nonspecific.  Hypermetabolic 1 cm right thyroid nodule, thyroid ultrasound recommended. 11/22/2020: Partial simple anterior vulvectomy, vulvar biopsies.  Findings at the time of surgery were an 8 x 8 mm area that was mildly erythematous and raised at the site of prior excision, stitch is still in place.  No surrounding induration noted.  2 superficial areas of mild skin discoloration on the right vulva were biopsied. 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  Right vulvar biopsies with no dysplasia or carcinoma, 1 showing ulcer with scale crust and the other acute inflammation with fibrosis. Discussed at tumor board. Given difficulty determining if any invasion, only microscopic focus on re-excision, and PET negative for metastatic disease, recommendation made for close surveillance.   Interval History: Doing well.  Denies any vulvar pain, pruritus, or new lesions.  Had a yeast infection recently, treated with boric acid with resolution of symptoms.  Hip pain that she was having previously has resolved.  Was having some bright red blood per rectum for several weeks shortly after her visit with me, was scheduled for a GI appointment but had to miss this secondary to a change in work schedule.  Needs to call to reschedule.  Past Medical/Surgical History: Past  Medical History:  Diagnosis Date   Adrenal calcification (HCC)    Cancer (HCC)    squamous cell carcinoma, vulvar ca 2022   COVID 10/10/2020   fever sore throat cough x 8-10-3 days rapid home test and work pcr done, pt does not have pcr results   Dysrhythmia    followed by PCP in   years ago for palpatations and rate control   GERD (gastroesophageal reflux disease)    Headache    rare migraines   History of kidney stones    HTN (hypertension)    Hypothyroidism    Insomnia    Pneumonia    Seizures (HCC)    no seizure episodes in "years"    Past Surgical History:  Procedure Laterality Date   ABDOMINAL HYSTERECTOMY     BILATERAL SALPINGECTOMY Bilateral 12/12/2013   Procedure: BILATERAL SALPINGECTOMY;  Surgeon: Meriel Pica, MD;  Location: [facility];  Service: Gynecology;  Laterality: Bilateral;   BREAST CYST EXCISION Left 06/17/2021   Procedure: CYST EXCISION LEFT BREAST;  Surgeon: Abigail Miyamoto, MD;  Location: [facility];  Service: General;  Laterality: Left;   ESOPHAGOGASTRODUODENOSCOPY N/A 10/01/2015   Dr. Darrick Penna: normal esophagus, mild gastritis, duodenal diverticulum    LAPAROSCOPIC ASSISTED VAGINAL HYSTERECTOMY N/A 12/12/2013   uterine fibroids   LEEP  2004   VULVECTOMY N/A 11/22/2020   Procedure: WIDE EXCISION VULVECTOMY;  Surgeon: Carver Fila, MD;  Location: [facility];  Service: Gynecology;  Laterality: N/A;    Family History  Problem Relation Age of Onset   Cancer Father        Unsure of type of cancer   Hypertension Father  Diabetes Father    Breast cancer Maternal Aunt 30   Stomach cancer Paternal Grandmother    Colon cancer Neg Hx    Endometrial cancer Neg Hx    Pancreatic cancer Neg Hx    Prostate cancer Neg Hx     Social History   Socioeconomic History   Marital status: Married    Spouse name: Not on file   Number of children: Not on file   Years of education: Not on file   Highest education  level: Not on file  Occupational History   Occupation: RN    Comment: [employer]   Tobacco Use   Smoking status: Former    Current packs/day: 0.00    Types: Cigarettes    Start date: 1998    Quit date: 2008    Years since quitting: 16.5   Smokeless tobacco: Never  Vaping Use   Vaping status: Never Used  Substance and Sexual Activity   Alcohol use: No    Alcohol/week: 0.0 standard drinks of alcohol   Drug use: No   Sexual activity: Yes    Birth control/protection: Surgical  Other Topics Concern   Not on file  Social History Narrative   Not on file   Social Determinants of Health   Financial Resource Strain: Not on file  Food Insecurity: Not on file  Transportation Needs: Not on file  Physical Activity: Not on file  Stress: Not on file  Social Connections: Not on file    Current Medications:  Current Outpatient Medications:    acetaminophen (TYLENOL) 325 MG tablet, Take 650 mg by mouth every 6 (six) hours as needed for moderate pain., Disp: , Rfl:    atenolol (TENORMIN) 25 MG tablet, Take 25 mg by mouth 2 (two) times daily., Disp: , Rfl:    furosemide (LASIX) 20 MG tablet, Take 20-40 mg by mouth daily., Disp: , Rfl:    LamoTRIgine 100 MG TB24 24 hour tablet, Take 100 mg by mouth daily., Disp: , Rfl:    zolpidem (AMBIEN) 10 MG tablet, Take 10 mg by mouth at bedtime., Disp: , Rfl:   Review of Systems: Denies appetite changes, fevers, chills, fatigue, unexplained weight changes. Denies hearing loss, neck lumps or masses, mouth sores, ringing in ears or voice changes. Denies cough or wheezing.  Denies shortness of breath. Denies chest pain or palpitations. Denies leg swelling. Denies abdominal distention, pain, blood in stools, constipation, diarrhea, nausea, vomiting, or early satiety. Denies pain with intercourse, dysuria, frequency, hematuria or incontinence. Denies hot flashes, pelvic pain, vaginal bleeding or vaginal discharge.   Denies joint pain, back pain  or muscle pain/cramps. Denies itching, rash, or wounds. Denies dizziness, headaches, numbness or seizures. Denies swollen lymph nodes or glands, denies easy bruising or bleeding. Denies anxiety, depression, confusion, or decreased concentration.  Physical Exam: BP (!) 181/107 (BP Location: Left Arm, Patient Position: Sitting) Comment: MD notified there is an issue with the pharmacy refilling her bp meds, so she has not taken her normal dose in a while- pt. says it should be fixed in a few days.  Pulse 65   Temp 98.4 F (36.9 C) (Oral)   Resp 17   Ht 5\' 4"  (1.626 m)   Wt 219 lb (99.3 kg)   LMP 05/05/2013   SpO2 100%   BMI 37.59 kg/m  General: Alert, oriented, no acute distress. HEENT: Normocephalic, atraumatic, sclera anicteric. Chest: Clear to auscultation bilaterally.  No wheezes or rhonchi. Cardiovascular: Regular rate and rhythm, no murmurs.  Abdomen: Obese, soft, nontender.  Normoactive bowel sounds.  No masses or hepatosplenomegaly appreciated.   Extremities: Grossly normal range of motion.  Warm, well perfused.  No edema bilaterally. Skin: No rashes or lesions noted. Lymphatics: No cervical, supraclavicular, or inguinal adenopathy. GU: Vulvosvopy was performed.  Prominent scar but no associated acetowhite areas after application of 5% acetic acid.  Several areas that appeared consistent with either ingrown hairs or folliculitis.  No atypical vascularity.  Bimanual exam, no masses or nodularity appreciated within the pelvis.  On speculum exam, vaginal tissue is normal in appearance without masses.  Large hemorrhoid noted again.  Laboratory & Radiologic Studies: None new  Assessment & Plan: [name] is a 42 y.o. 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 Surgical excision was in 11/2020.   Patient is doing well, NED on exam today.  Patient was quite hypertensive today.  Denies any  symptoms including headache, blurry vision.  Has been off blood pressure medicine secondary to an issue with the pharmacy.  Is hoping this will soon be resolved.  Hip pain which she was having at her last visit has resolved.   She is now almost 2 years out from treatment.  Per NCCN surveillance recommendations, we will transition to visits every 6 months We reviewed signs and symptoms that would be concerning for disease recurrence, the patient knows to call if she develops any of these between visits.  20 minutes of total time was spent for this patient encounter, including preparation, face-to-face counseling with the patient and coordination of care, and documentation of the encounter.  Eugene Garnet, MD  Division of Gynecologic Oncology  Department of Obstetrics and Gynecology  [facility]

## 2022-12-31 ENCOUNTER — Encounter: Payer: Self-pay | Admitting: Obstetrics and Gynecology

## 2023-02-12 ENCOUNTER — Ambulatory Visit: Payer: BC Managed Care – PPO | Admitting: Gynecologic Oncology

## 2023-02-23 ENCOUNTER — Encounter: Payer: Self-pay | Admitting: Gynecologic Oncology

## 2023-02-23 ENCOUNTER — Other Ambulatory Visit: Payer: Self-pay | Admitting: Obstetrics and Gynecology

## 2023-02-23 DIAGNOSIS — Z0000 Encounter for general adult medical examination without abnormal findings: Secondary | ICD-10-CM

## 2023-02-24 ENCOUNTER — Telehealth: Payer: Self-pay

## 2023-02-24 DIAGNOSIS — B3731 Acute candidiasis of vulva and vagina: Secondary | ICD-10-CM

## 2023-02-24 MED ORDER — FLUCONAZOLE 100 MG PO TABS: 100.0000 mg | ORAL_TABLET | Freq: Once | ORAL | 0 refills | Status: AC

## 2023-02-24 NOTE — Telephone Encounter (Signed)
Pt states she is having burning/itching on the vulva area. No vaginal discharge. No odor.  She states she has had the Diflucan before and it has worked. Aware I will pass this message along to Warner Mccreedy NP. She was thankful for the call.

## 2023-02-24 NOTE — Telephone Encounter (Signed)
LVM for [name] to call back regarding the Mychart message she sent. More information needed

## 2023-02-24 NOTE — Telephone Encounter (Signed)
Pt is aware of medication being sent to her pharmacy. She will call if no better in 1 week.

## 2023-03-05 ENCOUNTER — Ambulatory Visit
Admission: RE | Admit: 2023-03-05 | Discharge: 2023-03-05 | Disposition: A | Discharge status: Home / Self Care | Payer: BC Managed Care – PPO | Source: Ambulatory Visit | Attending: Obstetrics and Gynecology | Admitting: Obstetrics and Gynecology

## 2023-03-05 DIAGNOSIS — Z0000 Encounter for general adult medical examination without abnormal findings: Secondary | ICD-10-CM

## 2023-04-02 ENCOUNTER — Inpatient Hospital Stay: Payer: BC Managed Care – PPO | Attending: Gynecologic Oncology | Admitting: Gynecologic Oncology

## 2023-04-02 ENCOUNTER — Encounter: Payer: Self-pay | Admitting: Gynecologic Oncology

## 2023-04-02 VITALS — BP 140/88 | HR 63 | Temp 98.7°F | Resp 20 | Wt 213.9 lb

## 2023-04-02 DIAGNOSIS — Z8544 Personal history of malignant neoplasm of other female genital organs: Secondary | ICD-10-CM | POA: Insufficient documentation

## 2023-04-02 DIAGNOSIS — C519 Malignant neoplasm of vulva, unspecified: Secondary | ICD-10-CM

## 2023-04-02 NOTE — Patient Instructions (Signed)
It was good to see you today.  I do not see or feel any evidence of cancer recurrence on your exam.  I will see you for follow-up in 6 months.  As always, if you develop any new and concerning symptoms before your next visit, please call to see me sooner.   

## 2023-04-02 NOTE — Progress Notes (Signed)
Gynecologic Oncology Return Clinic Visit  04/02/23  Reason for Visit: surveillance  Treatment History: The patient presented with a 2-year history of a palpable lesion above her clitoris. 11/05/2020: Exophytic supra clitoral lesion excised in clinic.  Pathology from this noted a 1 x 1 x 0.5 cm lesion, biopsy noted to be superficial and assessment of invasion could not be completed. 11/19/2020: PET shows ill-defined hypermetabolic activity of the vulva.  Nodular cutaneous focus of abnormal hypermetabolic activity along the right anterior labia, nonspecific.  Hypermetabolic 1 cm right thyroid nodule, thyroid ultrasound recommended. 11/22/2020: Partial simple anterior vulvectomy, vulvar biopsies.  Findings at the time of surgery were an 8 x 8 mm area that was mildly erythematous and raised at the site of prior excision, stitch is still in place.  No surrounding induration noted.  2 superficial areas of mild skin discoloration on the right vulva were biopsied. 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  Right vulvar biopsies with no dysplasia or carcinoma, 1 showing ulcer with scale crust and the other acute inflammation with fibrosis. Discussed at tumor board. Given difficulty determining if any invasion, only microscopic focus on re-excision, and PET negative for metastatic disease, recommendation made for close surveillance.  Interval History: Doing well.  Had COVID approximately 3 months ago.  Denies any vulvar symptoms including pruritus, pain, bleeding, or discharge.  Reports baseline bowel bladder function.  Past Medical/Surgical History: Past Medical History:  Diagnosis Date   Adrenal calcification (HCC)    Cancer (HCC)    squamous cell carcinoma, vulvar ca 2022   COVID 10/10/2020   fever sore throat cough x 8-10-3 days rapid home test and work pcr done, pt does not have pcr results   Dysrhythmia    followed by PCP in  [place]  years ago for palpatations and rate control   GERD (gastroesophageal reflux disease)    Headache    rare migraines   History of kidney stones    HTN (hypertension)    Hypothyroidism    Insomnia    Pneumonia    Seizures (HCC)    no seizure episodes in "years"    Past Surgical History:  Procedure Laterality Date   ABDOMINAL HYSTERECTOMY     BILATERAL SALPINGECTOMY Bilateral 12/12/2013   Procedure: BILATERAL SALPINGECTOMY;  Surgeon: Meriel Pica, MD;  Location: [facility];  Service: Gynecology;  Laterality: Bilateral;   BREAST CYST EXCISION Left 06/17/2021   Procedure: CYST EXCISION LEFT BREAST;  Surgeon: Abigail Miyamoto, MD;  Location: [facility];  Service: General;  Laterality: Left;   ESOPHAGOGASTRODUODENOSCOPY N/A 10/01/2015   Dr. Darrick Penna: normal esophagus, mild gastritis, duodenal diverticulum    LAPAROSCOPIC ASSISTED VAGINAL HYSTERECTOMY N/A 12/12/2013   uterine fibroids   LEEP  2004   VULVECTOMY N/A 11/22/2020   Procedure: WIDE EXCISION VULVECTOMY;  Surgeon: Carver Fila, MD;  Location: [facility];  Service: Gynecology;  Laterality: N/A;    Family History  Problem Relation Age of Onset   Cancer Father        Unsure of type of cancer   Hypertension Father    Diabetes Father    Breast cancer Maternal Aunt 30   Stomach cancer Paternal Grandmother    Colon cancer Neg Hx    Endometrial cancer Neg Hx    Pancreatic cancer Neg Hx    Prostate cancer Neg Hx     Social History   Socioeconomic History   Marital status:  Married    Spouse name: Not on file   Number of children: Not on file   Years of education: Not on file   Highest education level: Not on file  Occupational History   Occupation: RN    Comment: [employer]   Tobacco Use   Smoking status: Former    Current packs/day: 0.00    Types: Cigarettes    Start date: 1998    Quit date: 2008    Years since quitting: 16.9   Smokeless tobacco: Never  Vaping  Use   Vaping status: Never Used  Substance and Sexual Activity   Alcohol use: No    Alcohol/week: 0.0 standard drinks of alcohol   Drug use: No   Sexual activity: Yes    Birth control/protection: Surgical  Other Topics Concern   Not on file  Social History Narrative   Not on file   Social Drivers of Health   Financial Resource Strain: Not on file  Food Insecurity: Not on file  Transportation Needs: Not on file  Physical Activity: Not on file  Stress: Not on file  Social Connections: Not on file    Current Medications:  Current Outpatient Medications:    acetaminophen (TYLENOL) 325 MG tablet, Take 650 mg by mouth every 6 (six) hours as needed for moderate pain., Disp: , Rfl:    atenolol (TENORMIN) 25 MG tablet, Take 25 mg by mouth 2 (two) times daily., Disp: , Rfl:    furosemide (LASIX) 20 MG tablet, Take 20-40 mg by mouth daily., Disp: , Rfl:    LamoTRIgine 100 MG TB24 24 hour tablet, Take 100 mg by mouth daily., Disp: , Rfl:    Topiramate (TOPAMAX PO), Take 25 mg/day by mouth., Disp: , Rfl:    zolpidem (AMBIEN) 10 MG tablet, Take 10 mg by mouth at bedtime., Disp: , Rfl:   Review of Systems: Denies appetite changes, fevers, chills, fatigue, unexplained weight changes. Denies hearing loss, neck lumps or masses, mouth sores, ringing in ears or voice changes. Denies cough or wheezing.  Denies shortness of breath. Denies chest pain or palpitations. Denies leg swelling. Denies abdominal distention, pain, blood in stools, constipation, diarrhea, nausea, vomiting, or early satiety. Denies pain with intercourse, dysuria, frequency, hematuria or incontinence. Denies hot flashes, pelvic pain, vaginal bleeding or vaginal discharge.   Denies joint pain, back pain or muscle pain/cramps. Denies itching, rash, or wounds. Denies dizziness, headaches, numbness or seizures. Denies swollen lymph nodes or glands, denies easy bruising or bleeding. Denies anxiety, depression, confusion, or  decreased concentration.  Physical Exam: BP (!) 156/83 (BP Location: Left Arm, Patient Position: Sitting) Comment: Notified RN  Pulse 63   Temp 98.7 F (37.1 C) (Oral)   Resp 20   Wt 213 lb 14.4 oz (97 kg)   LMP 05/05/2013   SpO2 100%   BMI 36.72 kg/m  General: Alert, oriented, no acute distress. HEENT: Normocephalic, atraumatic, sclera anicteric. Chest: Clear to auscultation bilaterally.  No wheezes or rhonchi. Cardiovascular: Regular rate and rhythm, no murmurs. Abdomen: Obese, soft, nontender.  Normoactive bowel sounds.  No masses or hepatosplenomegaly appreciated.   Extremities: Grossly normal range of motion.  Warm, well perfused.  No edema bilaterally. Skin: No rashes or lesions noted. Lymphatics: No cervical, supraclavicular, or inguinal adenopathy. GU: Vulvosvopy was performed.  Prominent scar but no associated acetowhite areas after application of 5% acetic acid. No atypical vascularity.  Large hemorrhoid noted again.  Laboratory & Radiologic Studies: None new  Assessment & Plan: Barbara Herter  I Hughes is a 42 y.o. 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 Surgical excision was in 11/2020.   Patient is doing well, NED on exam today.   She is more than 2 years out from treatment.  Per NCCN surveillance recommendations, we will continue surveillance visits every 6 months. We reviewed signs and symptoms that would be concerning for disease recurrence, the patient knows to call if she develops any of these between visits.  22 minutes of total time was spent for this patient encounter, including preparation, face-to-face counseling with the patient and coordination of care, and documentation of the encounter.  Eugene Garnet, MD  Division of Gynecologic Oncology  Department of Obstetrics and Gynecology  [facility]

## 2023-08-12 NOTE — Addendum Note (Signed)
 Addended by: Darrin Emerald on: 08/12/2023 09:33 AM   Modules accepted: Level of Service

## 2023-10-01 ENCOUNTER — Encounter: Payer: Self-pay | Admitting: Gynecologic Oncology

## 2023-10-01 ENCOUNTER — Inpatient Hospital Stay: Payer: BC Managed Care – PPO | Attending: Gynecologic Oncology | Admitting: Gynecologic Oncology

## 2023-10-01 VITALS — BP 137/70 | HR 62 | Temp 97.8°F | Resp 16 | Ht 64.0 in | Wt 206.0 lb

## 2023-10-01 DIAGNOSIS — K629 Disease of anus and rectum, unspecified: Secondary | ICD-10-CM | POA: Insufficient documentation

## 2023-10-01 DIAGNOSIS — C519 Malignant neoplasm of vulva, unspecified: Secondary | ICD-10-CM

## 2023-10-01 DIAGNOSIS — Z8544 Personal history of malignant neoplasm of other female genital organs: Secondary | ICD-10-CM | POA: Diagnosis present

## 2023-10-01 DIAGNOSIS — Z9079 Acquired absence of other genital organ(s): Secondary | ICD-10-CM | POA: Diagnosis not present

## 2023-10-01 NOTE — Progress Notes (Signed)
 Gynecologic Oncology Return Clinic Visit  10/01/23  Reason for Visit: surveillance   Treatment History: The patient presented with a 2-year history of a palpable lesion above her clitoris. 11/05/2020: Exophytic supra clitoral lesion excised in clinic.  Pathology from this noted a 1 x 1 x 0.5 cm lesion, biopsy noted to be superficial and assessment of invasion could not be completed. 11/19/2020: PET shows ill-defined hypermetabolic activity of the vulva.  Nodular cutaneous focus of abnormal hypermetabolic activity along the right anterior labia, nonspecific.  Hypermetabolic 1 cm right thyroid nodule, thyroid ultrasound recommended. 11/22/2020: Partial simple anterior vulvectomy, vulvar biopsies.  Findings at the time of surgery were an 8 x 8 mm area that was mildly erythematous and raised at the site of prior excision, stitch is still in place.  No surrounding induration noted.  2 superficial areas of mild skin discoloration on the right vulva were biopsied. 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  Right vulvar biopsies with no dysplasia or carcinoma, 1 showing ulcer with scale crust and the other acute inflammation with fibrosis. Discussed at tumor board. Given difficulty determining if any invasion, only microscopic focus on re-excision, and PET negative for metastatic disease, recommendation made for close surveillance.   Interval History: Overall doing well although since her last visit with me has noted increased growth of a anal/rectal lesion that is causing pressure and pain.  This is frequently happening not associated with bowel movements, sometimes just when she is sitting.  She has had bleeding on and off.  Has used Tucks pads, not recently, without relief.  She denies any vulvar symptoms including pain, pruritus, discharge, or bleeding.  Past Medical/Surgical History: Past Medical History:  Diagnosis Date    Adrenal calcification (HCC)    Cancer (HCC)    squamous cell carcinoma, vulvar ca 2022   COVID 10/10/2020   fever sore throat cough x 8-10-3 days rapid home test and work pcr done, pt does not have pcr results   Dysrhythmia    followed by PCP in [place]  years ago for palpatations and rate control   GERD (gastroesophageal reflux disease)    Headache    rare migraines   History of kidney stones    HTN (hypertension)    Hypothyroidism    Insomnia    Pneumonia    Seizures (HCC)    no seizure episodes in years    Past Surgical History:  Procedure Laterality Date   ABDOMINAL HYSTERECTOMY     BILATERAL SALPINGECTOMY Bilateral 12/12/2013   Procedure: BILATERAL SALPINGECTOMY;  Surgeon: Piedad Brewer, MD;  Location: [facility];  Service: Gynecology;  Laterality: Bilateral;   BREAST CYST EXCISION Left 06/17/2021   Procedure: CYST EXCISION LEFT BREAST;  Surgeon: Oza Blumenthal, MD;  Location: [facility];  Service: General;  Laterality: Left;   ESOPHAGOGASTRODUODENOSCOPY N/A 10/01/2015   Dr. Nolene Baumgarten: normal esophagus, mild gastritis, duodenal diverticulum    LAPAROSCOPIC ASSISTED VAGINAL HYSTERECTOMY N/A 12/12/2013   uterine fibroids   LEEP  2004   VULVECTOMY N/A 11/22/2020   Procedure: WIDE EXCISION VULVECTOMY;  Surgeon: Suzi Essex, MD;  Location: [facility];  Service: Gynecology;  Laterality: N/A;    Family History  Problem Relation Age of Onset   Cancer Father        Unsure of type of cancer   Hypertension Father    Diabetes Father    Breast cancer Maternal Aunt 30   Stomach  cancer Paternal Grandmother    Colon cancer Neg Hx    Endometrial cancer Neg Hx    Pancreatic cancer Neg Hx    Prostate cancer Neg Hx     Social History   Socioeconomic History   Marital status: Married    Spouse name: Not on file   Number of children: Not on file   Years of education: Not on file   Highest education level: Not on file  Occupational  History   Occupation: RN    Comment: [employer]   Tobacco Use   Smoking status: Former    Current packs/day: 0.00    Types: Cigarettes    Start date: 1998    Quit date: 2008    Years since quitting: 17.4   Smokeless tobacco: Never  Vaping Use   Vaping status: Never Used  Substance and Sexual Activity   Alcohol use: No    Alcohol/week: 0.0 standard drinks of alcohol   Drug use: No   Sexual activity: Yes    Birth control/protection: Surgical  Other Topics Concern   Not on file  Social History Narrative   Not on file   Social Drivers of Health   Financial Resource Strain: Not on file  Food Insecurity: Not on file  Transportation Needs: Not on file  Physical Activity: Not on file  Stress: Not on file  Social Connections: Not on file    Current Medications:  Current Outpatient Medications:    acetaminophen  (TYLENOL ) 325 MG tablet, Take 650 mg by mouth every 6 (six) hours as needed for moderate pain., Disp: , Rfl:    atenolol (TENORMIN) 25 MG tablet, Take 25 mg by mouth 2 (two) times daily., Disp: , Rfl:    furosemide (LASIX) 20 MG tablet, Take 20-40 mg by mouth daily., Disp: , Rfl:    LamoTRIgine  100 MG TB24 24 hour tablet, Take 100 mg by mouth daily., Disp: , Rfl:    Topiramate (TOPAMAX PO), Take 25 mg/day by mouth., Disp: , Rfl:    zolpidem  (AMBIEN ) 10 MG tablet, Take 10 mg by mouth at bedtime., Disp: , Rfl:   Review of Systems: Denies appetite changes, fevers, chills, fatigue, unexplained weight changes. Denies hearing loss, neck lumps or masses, mouth sores, ringing in ears or voice changes. Denies cough or wheezing.  Denies shortness of breath. Denies chest pain or palpitations. Denies leg swelling. Denies abdominal distention, pain, blood in stools, constipation, diarrhea, nausea, vomiting, or early satiety. Denies pain with intercourse, dysuria, frequency, hematuria or incontinence. Denies hot flashes, pelvic pain, vaginal bleeding or vaginal discharge.    Denies joint pain, back pain or muscle pain/cramps. Denies itching, rash, or wounds. Denies dizziness, headaches, numbness or seizures. Denies swollen lymph nodes or glands, denies easy bruising or bleeding. Denies anxiety, depression, confusion, or decreased concentration.  Physical Exam: BP 137/70 (BP Location: Left Arm, Patient Position: Sitting)   Pulse 62   Temp 97.8 F (36.6 C) (Tympanic)   Resp 16   Ht 5' 4 (1.626 m)   Wt 206 lb (93.4 kg)   LMP 05/05/2013   SpO2 100%   BMI 35.36 kg/m  General: Alert, oriented, no acute distress. HEENT: Normocephalic, atraumatic, sclera anicteric. Chest: Clear to auscultation bilaterally.  No wheezes or rhonchi. Cardiovascular: Regular rate and rhythm, no murmurs. Abdomen: Obese, soft, nontender.  Normoactive bowel sounds.  No masses or hepatosplenomegaly appreciated.   Extremities: Grossly normal range of motion.  Warm, well perfused.  No edema bilaterally. Skin: No rashes or  lesions noted. Lymphatics: No cervical, supraclavicular, or inguinal adenopathy. GU: Vulvosvopy was performed.  Prominent scar but no associated acetowhite areas after application of 5% acetic acid. No atypical vascularity.   There is increased size of what had previously been thought to be a hemorrhoid.  I do not see acetowhite changes after application of acetic acid over this area either.  On rectal exam, I do not feel that this represents prolapse, but offered that we could have her see one of the colorectal surgeons given significant symptomatology she is experiencing.  Laboratory & Radiologic Studies: None new  Assessment & Plan: [name] is a 43 y.o. 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 Surgical excision was in 11/2020.   Patient is doing well, NED on exam today.  Referral will be sent to [facility] given anal/rectal lesion, pain.  Had previously suspected  hemorrhoid but she is having worsening symptoms.   Per NCCN surveillance recommendations, we will continue surveillance visits every 6 months. We reviewed signs and symptoms that would be concerning for disease recurrence, the patient knows to call if she develops any of these between visits.  20 minutes of total time was spent for this patient encounter, including preparation, face-to-face counseling with the patient and coordination of care, and documentation of the encounter.  Wiley Hanger, MD  Division of Gynecologic Oncology  Department of Obstetrics and Gynecology  [facility]

## 2023-10-01 NOTE — Patient Instructions (Signed)
 It was good to see you today.  I do not see or feel any evidence of cancer recurrence on your exam.  I will see you for follow-up in 6 months.  As always, if you develop any new and concerning symptoms before your next visit, please call to see me sooner.

## 2023-11-19 ENCOUNTER — Telehealth: Payer: Self-pay

## 2023-11-19 NOTE — Telephone Encounter (Signed)
 Faxed over referral from Dr Viktoria for this patient to [facility].  [phone]

## 2023-12-08 ENCOUNTER — Telehealth: Payer: Self-pay | Admitting: *Deleted

## 2023-12-08 NOTE — Telephone Encounter (Signed)
 LMOM for the patient to call the office back regarding her the referral to [facility]. Per the referral department at the office of [facility] they reached out to the patient three times with no response from the patient. [facility] stated that it is now the reasonability of the patient to call the office.

## 2023-12-10 NOTE — Telephone Encounter (Signed)
 LMOM for the patient to call the office back regarding her the referral to [facility]. Per the referral department at the office of [facility] they reached out to the patient three times with no response from the patient. [facility] stated that it is now the reasonability of the patient to call the office.     Ask the patient to call [facility] and schedule appt and then let our office know when the appt is

## 2023-12-15 NOTE — Telephone Encounter (Signed)
 Patient scheduled to see Dr Teresa on 9/16

## 2024-02-18 ENCOUNTER — Ambulatory Visit: Payer: Self-pay | Admitting: Surgery

## 2024-02-18 DIAGNOSIS — Z01818 Encounter for other preprocedural examination: Secondary | ICD-10-CM

## 2024-02-18 NOTE — Progress Notes (Signed)
 Sent message, via epic in basket, requesting orders in epic from Careers adviser.

## 2024-02-24 NOTE — Patient Instructions (Signed)
 SURGICAL WAITING ROOM VISITATION  Patients having surgery or a procedure may have no more than 2 support people in the waiting area - these visitors may rotate.    Children under the age of 47 must have an adult with them who is not the patient.  Visitors with respiratory illnesses are discouraged from visiting and should remain at home.  If the patient needs to stay at the hospital during part of their recovery, the visitor guidelines for inpatient rooms apply. Pre-op nurse will coordinate an appropriate time for 1 support person to accompany patient in pre-op.  This support person may not rotate.    Please refer to the [facility] website for the visitor guidelines for Inpatients (after your surgery is over and you are in a regular room).    Your procedure is scheduled on: 03/01/24   Report to [facility] Main Entrance    Report to admitting at 5:15 AM   Call this number if you have problems the morning of surgery [phone]   Do not eat food or drink liquids :After Midnight.          If you have questions, please contact your surgeon's office.   FOLLOW BOWEL PREP AND ANY ADDITIONAL PRE OP INSTRUCTIONS YOU RECEIVED FROM YOUR SURGEON'S OFFICE!!!     Oral Hygiene is also important to reduce your risk of infection.                                    Remember - BRUSH YOUR TEETH THE MORNING OF SURGERY WITH YOUR REGULAR TOOTHPASTE  DENTURES WILL BE REMOVED PRIOR TO SURGERY PLEASE DO NOT APPLY Poly grip OR ADHESIVES!!!   Stop all vitamins and herbal supplements 7 days before surgery.   Take these medicines the morning of surgery with A SIP OF WATER : Tylenol , Atenolol, Lamotrigine , Topamax, Omeprazole, Zofran                               You may not have any metal on your body including hair pins, jewelry, and body piercing             Do not wear make-up, lotions, powders, perfumes, or deodorant  Do not wear nail polish including gel and S&S, artificial/acrylic nails,  or any other type of covering on natural nails including finger and toenails. If you have artificial nails, gel coating, etc. that needs to be removed by a nail salon please have this removed prior to surgery or surgery may need to be canceled/ delayed if the surgeon/ anesthesia feels like they are unable to be safely monitored.   Do not shave  48 hours prior to surgery.    Do not bring valuables to the hospital. [place] IS NOT             RESPONSIBLE   FOR VALUABLES.   Contacts, glasses, dentures or bridgework may not be worn into surgery.  DO NOT BRING YOUR HOME MEDICATIONS TO THE HOSPITAL. PHARMACY WILL DISPENSE MEDICATIONS LISTED ON YOUR MEDICATION LIST TO YOU DURING YOUR ADMISSION IN THE HOSPITAL!    Patients discharged on the day of surgery will not be allowed to drive home.  Someone NEEDS to stay with you for the first 24 hours after anesthesia.              Please read over the following  fact sheets you were given: IF YOU HAVE QUESTIONS ABOUT YOUR PRE-OP INSTRUCTIONS PLEASE CALL [phone] [place].   If you received a COVID test during your pre-op visit  it is requested that you wear a mask when out in public, stay away from anyone that may not be feeling well and notify your surgeon if you develop symptoms. If you test positive for Covid or have been in contact with anyone that has tested positive in the last 10 days please notify you surgeon.     - Preparing for Surgery Before surgery, you can play an important role.  Because skin is not sterile, your skin needs to be as free of germs as possible.  You can reduce the number of germs on your skin by washing with CHG (chlorahexidine gluconate) soap before surgery.  CHG is an antiseptic cleaner which kills germs and bonds with the skin to continue killing germs even after washing. Please DO NOT use if you have an allergy to CHG or antibacterial soaps.  If your skin becomes reddened/irritated stop using the CHG and inform  your nurse when you arrive at Short Stay. Do not shave (including legs and underarms) for at least 48 hours prior to the first CHG shower.  You may shave your face/neck.  Please follow these instructions carefully:  1.  Shower with CHG Soap the night before surgery ONLY (DO NOT USE THE SOAP THE MORNING OF SURGERY).  2.  If you choose to wash your hair, wash your hair first as usual with your normal  shampoo.  3.  After you shampoo, rinse your hair and body thoroughly to remove the shampoo.                             4.  Use CHG as you would any other liquid soap.  You can apply chg directly to the skin and wash.  Gently with a scrungie or clean washcloth.  5.  Apply the CHG Soap to your body ONLY FROM THE NECK DOWN.   Do   not use on face/ open                           Wound or open sores. Avoid contact with eyes, ears mouth and   genitals (private parts).                       Wash face,  Genitals (private parts) with your normal soap.             6.  Wash thoroughly, paying special attention to the area where your    surgery  will be performed.  7.  Thoroughly rinse your body with warm water  from the neck down.  8.  DO NOT shower/wash with your normal soap after using and rinsing off the CHG Soap.                9.  Pat yourself dry with a clean towel.            10.  Wear clean pajamas.            11.  Place clean sheets on your bed the night of your first shower and do not  sleep with pets. Day of Surgery : Do not apply any CHG, lotions/deodorants the morning of surgery.  Please wear clean clothes to the hospital/surgery center.  FAILURE TO FOLLOW THESE INSTRUCTIONS MAY RESULT IN THE CANCELLATION OF YOUR SURGERY  PATIENT SIGNATURE_________________________________  NURSE SIGNATURE__________________________________  ________________________________________________________________________

## 2024-02-24 NOTE — Progress Notes (Addendum)
 COVID Vaccine Completed: yes  Date of COVID positive in last 90 days:  PCP - Jerilynn Carnes, MD Cardiologist - n/a  Chest x-ray - N/A EKG - 02/25/24 Epic/chart Stress Test - N/A ECHO - N/A Cardiac Cath - n/a Pacemaker/ICD device last checked:N/A Spinal Cord Stimulator:N/A  Bowel Prep - Fleet enema?  Sleep Study - N/A CPAP -   Fasting Blood Sugar - N/A Checks Blood Sugar _____ times a day  Last dose of GLP1 agonist-  Ozempic takes every other Friday GLP1 instructions:  Do not take after- 02/22/24   Last dose of SGLT-2 inhibitors-  N/A SGLT-2 instructions:  Do not take after     Blood Thinner Instructions: N/A Last dose:   Time: Aspirin Instructions:N/A Last Dose:  Activity level: Can go up a flight of stairs and perform activities of daily living without stopping and without symptoms of chest pain or shortness of breath.   Anesthesia review: HTN, seizures- last over 10 years ago per pt  Patient denies shortness of breath, fever, cough and chest pain at PAT appointment  Patient verbalized understanding of instructions that were given to them at the PAT appointment. Patient was also instructed that they will need to review over the PAT instructions again at home before surgery.

## 2024-02-25 ENCOUNTER — Encounter (HOSPITAL_COMMUNITY)
Admission: RE | Admit: 2024-02-25 | Discharge: 2024-02-25 | Disposition: A | Discharge status: Home / Self Care | Source: Ambulatory Visit | Attending: Surgery | Admitting: Surgery

## 2024-02-25 ENCOUNTER — Encounter (HOSPITAL_COMMUNITY): Payer: Self-pay

## 2024-02-25 ENCOUNTER — Other Ambulatory Visit: Payer: Self-pay

## 2024-02-25 VITALS — BP 146/94 | HR 88 | Temp 98.2°F | Resp 16 | Ht 64.0 in | Wt 182.0 lb

## 2024-02-25 DIAGNOSIS — Z01810 Encounter for preprocedural cardiovascular examination: Secondary | ICD-10-CM | POA: Diagnosis present

## 2024-02-25 DIAGNOSIS — Z01818 Encounter for other preprocedural examination: Secondary | ICD-10-CM | POA: Insufficient documentation

## 2024-02-25 DIAGNOSIS — I10 Essential (primary) hypertension: Secondary | ICD-10-CM | POA: Insufficient documentation

## 2024-02-25 DIAGNOSIS — Z01812 Encounter for preprocedural laboratory examination: Secondary | ICD-10-CM | POA: Diagnosis present

## 2024-02-25 LAB — CBC WITH DIFFERENTIAL/PLATELET
Abs Immature Granulocytes: 0.02 K/uL (ref 0.00–0.07)
Basophils Absolute: 0 K/uL (ref 0.0–0.1)
Basophils Relative: 1 %
Eosinophils Absolute: 0.3 K/uL (ref 0.0–0.5)
Eosinophils Relative: 4 %
HCT: 39.9 % (ref 36.0–46.0)
Hemoglobin: 13 g/dL (ref 12.0–15.0)
Immature Granulocytes: 0 %
Lymphocytes Relative: 39 %
Lymphs Abs: 3.1 K/uL (ref 0.7–4.0)
MCH: 29.8 pg (ref 26.0–34.0)
MCHC: 32.6 g/dL (ref 30.0–36.0)
MCV: 91.5 fL (ref 80.0–100.0)
Monocytes Absolute: 0.6 K/uL (ref 0.1–1.0)
Monocytes Relative: 8 %
Neutro Abs: 3.9 K/uL (ref 1.7–7.7)
Neutrophils Relative %: 48 %
Platelets: 278 K/uL (ref 150–400)
RBC: 4.36 MIL/uL (ref 3.87–5.11)
RDW: 11.9 % (ref 11.5–15.5)
WBC: 8 K/uL (ref 4.0–10.5)
nRBC: 0 % (ref 0.0–0.2)

## 2024-02-25 LAB — BASIC METABOLIC PANEL WITH GFR
Anion gap: 8 (ref 5–15)
BUN: <5 mg/dL — ABNORMAL LOW (ref 6–20)
CO2: 26 mmol/L (ref 22–32)
Calcium: 9 mg/dL (ref 8.9–10.3)
Chloride: 105 mmol/L (ref 98–111)
Creatinine, Ser: 0.61 mg/dL (ref 0.44–1.00)
GFR, Estimated: >60 mL/min (ref >60)
Glucose, Bld: 75 mg/dL (ref 70–99)
Potassium: 3.9 mmol/L (ref 3.5–5.1)
Sodium: 139 mmol/L (ref 135–145)

## 2024-02-29 NOTE — H&P (Signed)
 CC: Here today for surgery  HPI: [name] is an 43 y.o. 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 Viktoria, whom is seen in the office today as a referral by Dr. Viktoria for evaluation of anorectal lesion and pain.   Reports a history of anorectal pain and a visible nodule on the anal skin. She does do self examinations for her history of vulvar cancer. This will occasionally swell and become uncomfortable. The pain will typically last for minutes to an hour. She does describe some episodes in the past of significant bright red blood per rectum having even woken up with her underwear covered in blood as well as the bed sheets. Denies any prior history of colonoscopy.  Reports that she has recently started taking weight loss medication, injection, and has developed constipation with this. She reports a bowel movement every 3 days. She will take magnesium supplements to assist with that. She denies any fiber supplements, laxatives, or stool softeners otherwise.  She denies any changes in health or health history since we met in the office. No new medications/allergies. She states she is ready for surgery today.  PMH: Vulvar squamous of carcinoma  PSH: As above; denies any prior anorectal surgeries or procedures. Remote skin lesion excision from left breast with Dr. Vernetta  FHx: Father has some sort of anorectal cancer and is currently undergoing treatment; otherwise, denies any known family history of colorectal, breast, endometrial or ovarian cancer  Social Hx: Denies use of tobacco/EtOH/illicit drug. 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 She is here today by herself.  Review of Systems: A complete review of systems was obtained from the patient. I have reviewed this information and discussed as appropriate with the patient. See HPI as well for other ROS.    Past Medical  History:  Diagnosis Date   Adrenal calcification    bilateal   Cancer (HCC)    squamous cell carcinoma, vulvar ca 2022   COVID 10/10/2020   fever sore throat cough x 8-10-3 days rapid home test and work pcr done, pt does not have pcr results   Dysrhythmia    followed by PCP in [place]  years ago for palpatations and rate control   GERD (gastroesophageal reflux disease)    Headache    migraines   History of kidney stones    HTN (hypertension)    Hypothyroidism    no meds currently   Insomnia    Pneumonia    Seizures (HCC)    no seizure episodes in years    Past Surgical History:  Procedure Laterality Date   ABDOMINAL HYSTERECTOMY     BILATERAL SALPINGECTOMY Bilateral 12/12/2013   Procedure: BILATERAL SALPINGECTOMY;  Surgeon: Charlie CHRISTELLA Croak, MD;  Location: [facility];  Service: Gynecology;  Laterality: Bilateral;   BREAST CYST EXCISION Left 06/17/2021   Procedure: CYST EXCISION LEFT BREAST;  Surgeon: Vernetta Berg, MD;  Location: [facility];  Service: General;  Laterality: Left;   ESOPHAGOGASTRODUODENOSCOPY N/A 10/01/2015   Dr. Harvey: normal esophagus, mild gastritis, duodenal diverticulum    LAPAROSCOPIC ASSISTED VAGINAL HYSTERECTOMY N/A 12/12/2013   uterine fibroids   LEEP  2004   VULVECTOMY N/A 11/22/2020   Procedure: WIDE EXCISION VULVECTOMY;  Surgeon: Viktoria Comer SAUNDERS, MD;  [facility];  Service: Gynecology;  Laterality: N/A;    Family History  Problem Relation Age of Onset  Cancer Father        Unsure of type of cancer   Hypertension Father    Diabetes Father    Breast cancer Maternal Aunt 30   Stomach cancer Paternal Grandmother    Colon cancer Neg Hx    Endometrial cancer Neg Hx    Pancreatic cancer Neg Hx    Prostate cancer Neg Hx     Social:  reports that she quit smoking about 17 years ago. Her smoking use included cigarettes. She started smoking about 27 years ago. She has never used smokeless tobacco.  She reports that she does not drink alcohol and does not use drugs.  Allergies:  Allergies  Allergen Reactions   Albuterol Anaphylaxis, Hives and Shortness Of Breath    Tablet form, no issue with aerosol    Medications: I have reviewed the patient's current medications.  No results found for this or any previous visit (from the past 48 hours).  No results found.   PE Last menstrual period 05/05/2013. Constitutional: NAD; conversant Eyes: Moist conjunctiva; no lid lag; anicteric Lungs: Normal respiratory effort CV: RRR Psychiatric: Appropriate affect  No results found for this or any previous visit (from the past 48 hours).  No results found.  A/P: [name] is an 43 y.o. female with hx of HTN here for evaluation of perianal lesion versus external hemorrhoidal tag in the setting of recent squamous cell carcinoma of the vulva  - Bright red blood per rectum-referral to [facility]. She had had an EGD done in [place] but would like to switch to the GI practice here in [place].  - We discussed the anatomy and physiology of the GI tract and pathophysiology of hemorrhoids with associated illustrations using her hemorrhoid handbook, Chartered Loss Adjuster. - I recommended shebegin taking a fiber supplement (Metamucil/Citrucel/FiberCon/Benefiber/Konsyl), drinks 6-8, 8 oz glasses of water  per day, and work to minimize time on commode 2 to 3 minutes.  -Given history of vulvar squamous cell carcinoma and noted concerns externally, I also think it be reasonable to proceed with excision of perianal lesions/external hemorrhoids. Unclear at present if there could be any dysplastic type tissue versus truly benign hemorrhoidal tags I do think removal particular in her history makes sense.  - We have reviewed options going forward including further observation vs surgery -excision of perianal lesions/external hemorrhoids; anorectal exam under anesthesia - The planned procedure, material  risks (including, but not limited to, pain, bleeding, infection, scarring, need for blood transfusion, damage to anal sphincter, incontinence of gas and/or stool, need for additional procedures, anal stenosis, rare cases of pelvic sepsis which in severe cases may require things like a colostomy, recurrence, blood clot, pulmonary embolus, pneumonia, heart attack, stroke, death) benefits and alternatives to surgery were discussed at length. I noted a good probability that the procedure would help improve their symptoms. The patient's questions were answered to her satisfaction, she voiced understanding and elected to proceed with surgery. Additionally, we discussed typical postoperative expectations and the recovery process.    Lonni Pizza, MD [facility], A [facility]

## 2024-03-01 ENCOUNTER — Encounter (HOSPITAL_COMMUNITY): Payer: Self-pay | Admitting: Surgery

## 2024-03-01 ENCOUNTER — Other Ambulatory Visit: Payer: Self-pay

## 2024-03-01 ENCOUNTER — Ambulatory Visit (HOSPITAL_COMMUNITY): Payer: Self-pay | Admitting: Physician Assistant

## 2024-03-01 ENCOUNTER — Ambulatory Visit (HOSPITAL_COMMUNITY)

## 2024-03-01 ENCOUNTER — Encounter (HOSPITAL_COMMUNITY)
Admission: RE | Disposition: A | Discharge status: Home / Self Care | Payer: Self-pay | Source: Ambulatory Visit | Attending: Surgery

## 2024-03-01 ENCOUNTER — Ambulatory Visit (HOSPITAL_COMMUNITY)
Admission: RE | Admit: 2024-03-01 | Discharge: 2024-03-01 | Disposition: A | Discharge status: Home / Self Care | Source: Ambulatory Visit | Attending: Surgery | Admitting: Surgery

## 2024-03-01 DIAGNOSIS — K5900 Constipation, unspecified: Secondary | ICD-10-CM | POA: Insufficient documentation

## 2024-03-01 DIAGNOSIS — Z87891 Personal history of nicotine dependence: Secondary | ICD-10-CM | POA: Diagnosis not present

## 2024-03-01 DIAGNOSIS — I10 Essential (primary) hypertension: Secondary | ICD-10-CM | POA: Diagnosis not present

## 2024-03-01 DIAGNOSIS — K219 Gastro-esophageal reflux disease without esophagitis: Secondary | ICD-10-CM | POA: Insufficient documentation

## 2024-03-01 DIAGNOSIS — Z79899 Other long term (current) drug therapy: Secondary | ICD-10-CM | POA: Insufficient documentation

## 2024-03-01 DIAGNOSIS — K6289 Other specified diseases of anus and rectum: Secondary | ICD-10-CM | POA: Diagnosis present

## 2024-03-01 DIAGNOSIS — Z8544 Personal history of malignant neoplasm of other female genital organs: Secondary | ICD-10-CM | POA: Insufficient documentation

## 2024-03-01 DIAGNOSIS — K644 Residual hemorrhoidal skin tags: Secondary | ICD-10-CM | POA: Diagnosis not present

## 2024-03-01 HISTORY — PX: RECTAL EXAM UNDER ANESTHESIA: SHX6399

## 2024-03-01 HISTORY — PX: HEMORRHOID SURGERY: SHX153

## 2024-03-01 HISTORY — PX: CONDYLOMA EXCISION/FULGURATION: SHX1389

## 2024-03-01 SURGERY — REMOVAL, CONDYLOMA
Anesthesia: General

## 2024-03-01 MED ORDER — FENTANYL CITRATE (PF) 100 MCG/2ML IJ SOLN
INTRAMUSCULAR | Status: DC | PRN
  Administered 2024-03-01: 50 ug via INTRAVENOUS
  Administered 2024-03-01: 100 ug via INTRAVENOUS
  Administered 2024-03-01: 50 ug via INTRAVENOUS

## 2024-03-01 MED ORDER — LACTATED RINGERS IV SOLN: INTRAVENOUS | Status: DC

## 2024-03-01 MED ORDER — LIDOCAINE HCL (PF) 2 % IJ SOLN
INTRAMUSCULAR | Status: AC
  Filled 2024-03-01: qty 5

## 2024-03-01 MED ORDER — TRAMADOL HCL 50 MG PO TABS: 50.0000 mg | ORAL_TABLET | Freq: Four times a day (QID) | ORAL | 0 refills | Status: AC | PRN

## 2024-03-01 MED ORDER — CHLORHEXIDINE GLUCONATE CLOTH 2 % EX PADS: 6.0000 | MEDICATED_PAD | Freq: Once | CUTANEOUS | Status: DC

## 2024-03-01 MED ORDER — OXYCODONE HCL 5 MG PO TABS
ORAL_TABLET | ORAL | Status: AC
  Filled 2024-03-01: qty 1

## 2024-03-01 MED ORDER — FLEET ENEMA RE ENEM: 1.0000 | ENEMA | Freq: Once | RECTAL | Status: DC

## 2024-03-01 MED ORDER — ONDANSETRON HCL 4 MG/2ML IJ SOLN
INTRAMUSCULAR | Status: DC | PRN
  Administered 2024-03-01 (×2): 4 mg via INTRAVENOUS

## 2024-03-01 MED ORDER — FENTANYL CITRATE (PF) 50 MCG/ML IJ SOSY
PREFILLED_SYRINGE | INTRAMUSCULAR | Status: AC
  Filled 2024-03-01: qty 1

## 2024-03-01 MED ORDER — ORAL CARE MOUTH RINSE
15.0000 mL | Freq: Once | OROMUCOSAL | Status: AC
Start: 2024-03-01 — End: 2024-03-01

## 2024-03-01 MED ORDER — PROMETHAZINE (PHENERGAN) 6.25MG IN NS 50ML IVPB
6.2500 mg | Freq: Once | INTRAVENOUS | Status: AC
  Administered 2024-03-01: 6.25 mg via INTRAVENOUS
  Filled 2024-03-01: qty 6.25

## 2024-03-01 MED ORDER — METHYLENE BLUE 20 MG/2ML IV SOSY
PREFILLED_SYRINGE | INTRAVENOUS | Status: AC
  Filled 2024-03-01: qty 2

## 2024-03-01 MED ORDER — ACETAMINOPHEN 500 MG PO TABS
1000.0000 mg | ORAL_TABLET | ORAL | Status: AC
  Administered 2024-03-01: 1000 mg via ORAL
  Filled 2024-03-01: qty 2

## 2024-03-01 MED ORDER — BUPIVACAINE LIPOSOME 1.3 % IJ SUSP
INTRAMUSCULAR | Status: AC
  Filled 2024-03-01: qty 20

## 2024-03-01 MED ORDER — PHENYLEPHRINE 80 MCG/ML (10ML) SYRINGE FOR IV PUSH (FOR BLOOD PRESSURE SUPPORT)
PREFILLED_SYRINGE | INTRAVENOUS | Status: AC
  Filled 2024-03-01: qty 10

## 2024-03-01 MED ORDER — ONDANSETRON HCL 4 MG/2ML IJ SOLN
INTRAMUSCULAR | Status: AC
Start: 2024-03-01 — End: 2024-03-01
  Filled 2024-03-01: qty 2

## 2024-03-01 MED ORDER — BUPIVACAINE-EPINEPHRINE (PF) 0.25% -1:200000 IJ SOLN
INTRAMUSCULAR | Status: AC
  Filled 2024-03-01: qty 30

## 2024-03-01 MED ORDER — MIDAZOLAM HCL 5 MG/5ML IJ SOLN
INTRAMUSCULAR | Status: DC | PRN
  Administered 2024-03-01: 2 mg via INTRAVENOUS

## 2024-03-01 MED ORDER — BUPIVACAINE-EPINEPHRINE (PF) 0.25% -1:200000 IJ SOLN
INTRAMUSCULAR | Status: DC | PRN
  Administered 2024-03-01: 30 mL via PERINEURAL

## 2024-03-01 MED ORDER — ONDANSETRON HCL 4 MG/2ML IJ SOLN: 4.0000 mg | Freq: Once | INTRAMUSCULAR | Status: DC | PRN

## 2024-03-01 MED ORDER — OXYCODONE HCL 5 MG PO TABS
5.0000 mg | ORAL_TABLET | Freq: Once | ORAL | Status: AC | PRN
  Administered 2024-03-01: 5 mg via ORAL

## 2024-03-01 MED ORDER — ARTIFICIAL TEARS OPHTHALMIC OINT
TOPICAL_OINTMENT | OPHTHALMIC | Status: AC
  Filled 2024-03-01: qty 3.5

## 2024-03-01 MED ORDER — FENTANYL CITRATE (PF) 100 MCG/2ML IJ SOLN
INTRAMUSCULAR | Status: AC
  Filled 2024-03-01: qty 2

## 2024-03-01 MED ORDER — OXYCODONE HCL 5 MG/5ML PO SOLN: 5.0000 mg | Freq: Once | ORAL | Status: AC | PRN

## 2024-03-01 MED ORDER — PROPOFOL 10 MG/ML IV BOLUS
INTRAVENOUS | Status: DC | PRN
  Administered 2024-03-01: 180 mg via INTRAVENOUS

## 2024-03-01 MED ORDER — 0.9 % SODIUM CHLORIDE (POUR BTL) OPTIME
TOPICAL | Status: DC | PRN
  Administered 2024-03-01: 1000 mL

## 2024-03-01 MED ORDER — DEXAMETHASONE SOD PHOSPHATE PF 10 MG/ML IJ SOLN
INTRAMUSCULAR | Status: DC | PRN
  Administered 2024-03-01: 10 mg via INTRAVENOUS

## 2024-03-01 MED ORDER — AMISULPRIDE (ANTIEMETIC) 5 MG/2ML IV SOLN
INTRAVENOUS | Status: AC
  Filled 2024-03-01: qty 4

## 2024-03-01 MED ORDER — CHLORHEXIDINE GLUCONATE 0.12 % MT SOLN
15.0000 mL | Freq: Once | OROMUCOSAL | Status: AC
  Administered 2024-03-01: 15 mL via OROMUCOSAL

## 2024-03-01 MED ORDER — ONDANSETRON HCL 4 MG/2ML IJ SOLN
INTRAMUSCULAR | Status: AC
  Filled 2024-03-01: qty 2

## 2024-03-01 MED ORDER — BUPIVACAINE LIPOSOME 1.3 % IJ SUSP: 20.0000 mL | Freq: Once | INTRAMUSCULAR | Status: DC

## 2024-03-01 MED ORDER — LIDOCAINE HCL (PF) 2 % IJ SOLN
INTRAMUSCULAR | Status: DC | PRN
  Administered 2024-03-01: 60 mg via INTRADERMAL

## 2024-03-01 MED ORDER — SODIUM CHLORIDE 0.9 % IV SOLN
2.0000 g | INTRAVENOUS | Status: AC
  Administered 2024-03-01: 2 g via INTRAVENOUS
  Filled 2024-03-01: qty 2

## 2024-03-01 MED ORDER — AMISULPRIDE (ANTIEMETIC) 5 MG/2ML IV SOLN
10.0000 mg | Freq: Once | INTRAVENOUS | Status: AC | PRN
  Administered 2024-03-01: 10 mg via INTRAVENOUS

## 2024-03-01 MED ORDER — PROPOFOL 10 MG/ML IV BOLUS
INTRAVENOUS | Status: AC
Start: 2024-03-01 — End: 2024-03-01
  Filled 2024-03-01: qty 20

## 2024-03-01 MED ORDER — DIBUCAINE (PERIANAL) 1 % EX OINT
TOPICAL_OINTMENT | CUTANEOUS | Status: DC | PRN
Start: 2024-03-01 — End: 2024-03-01
  Administered 2024-03-01: 1 via RECTAL

## 2024-03-01 MED ORDER — MIDAZOLAM HCL 2 MG/2ML IJ SOLN
INTRAMUSCULAR | Status: AC
  Filled 2024-03-01: qty 2

## 2024-03-01 MED ORDER — FENTANYL CITRATE (PF) 50 MCG/ML IJ SOSY
25.0000 ug | PREFILLED_SYRINGE | INTRAMUSCULAR | Status: DC | PRN
  Administered 2024-03-01: 50 ug via INTRAVENOUS

## 2024-03-01 MED ORDER — DIBUCAINE (PERIANAL) 1 % EX OINT
TOPICAL_OINTMENT | CUTANEOUS | Status: AC
  Filled 2024-03-01: qty 28

## 2024-03-01 MED ORDER — ACETAMINOPHEN 10 MG/ML IV SOLN
1000.0000 mg | Freq: Once | INTRAVENOUS | Status: DC | PRN
Start: 2024-03-01 — End: 2024-03-01

## 2024-03-01 MED ORDER — PHENYLEPHRINE 80 MCG/ML (10ML) SYRINGE FOR IV PUSH (FOR BLOOD PRESSURE SUPPORT)
PREFILLED_SYRINGE | INTRAVENOUS | Status: DC | PRN
  Administered 2024-03-01: 80 ug via INTRAVENOUS

## 2024-03-01 SURGICAL SUPPLY — 37 items
BAG COUNTER SPONGE SURGICOUNT (BAG) IMPLANT
BENZOIN TINCTURE PRP APPL 2/3 (GAUZE/BANDAGES/DRESSINGS) IMPLANT
BLADE SURG 15 STRL LF DISP TIS (BLADE) IMPLANT
BRIEF MESH DISP LRG (UNDERPADS AND DIAPERS) ×2 IMPLANT
COVER SURGICAL LIGHT HANDLE (MISCELLANEOUS) ×2 IMPLANT
DISSECTOR SURG LIGASURE 21 (MISCELLANEOUS) IMPLANT
DRAPE LAPAROTOMY T 102X78X121 (DRAPES) ×2 IMPLANT
ELECT NDL BLADE 2-5/6 (NEEDLE) IMPLANT
ELECT NDL TIP 2.8 STRL (NEEDLE) ×2 IMPLANT
ELECT NEEDLE BLADE 2-5/6 (NEEDLE) IMPLANT
ELECT NEEDLE TIP 2.8 STRL (NEEDLE) ×1 IMPLANT
ELECT PENCIL ROCKER SW 15FT (MISCELLANEOUS) IMPLANT
ELECT REM PT RETURN 15FT ADLT (MISCELLANEOUS) ×2 IMPLANT
GAUZE 4X4 16PLY ~~LOC~~+RFID DBL (SPONGE) ×2 IMPLANT
GAUZE PAD ABD 8X10 STRL (GAUZE/BANDAGES/DRESSINGS) IMPLANT
GAUZE SPONGE 4X4 12PLY STRL (GAUZE/BANDAGES/DRESSINGS) IMPLANT
GLOVE BIO SURGEON STRL SZ7.5 (GLOVE) ×2 IMPLANT
GLOVE INDICATOR 8.0 STRL GRN (GLOVE) ×2 IMPLANT
GOWN STRL REUS W/ TWL XL LVL3 (GOWN DISPOSABLE) ×2 IMPLANT
KIT BASIN OR (CUSTOM PROCEDURE TRAY) ×2 IMPLANT
KIT TURNOVER KIT A (KITS) ×2 IMPLANT
LOOP VESSEL MAXI BLUE (MISCELLANEOUS) IMPLANT
NDL HYPO 22X1.5 SAFETY MO (MISCELLANEOUS) ×2 IMPLANT
NEEDLE HYPO 22X1.5 SAFETY MO (MISCELLANEOUS) ×1 IMPLANT
PACK BASIC VI WITH GOWN DISP (CUSTOM PROCEDURE TRAY) ×2 IMPLANT
PAK SCROTO (SET/KITS/TRAYS/PACK) IMPLANT
RETRACTOR RING URO 16.6X16.6 (MISCELLANEOUS) IMPLANT
RETRACTOR STAY HOOK 5MM (MISCELLANEOUS) IMPLANT
SHEARS HARMONIC 9CM CVD (BLADE) IMPLANT
SPIKE FLUID TRANSFER (MISCELLANEOUS) ×2 IMPLANT
SURGILUBE 2OZ TUBE FLIPTOP (MISCELLANEOUS) ×2 IMPLANT
SUT CHROMIC 2 0 SH (SUTURE) IMPLANT
SUT CHROMIC 3 0 SH 27 (SUTURE) IMPLANT
SUT VIC AB 2-0 SH 27X BRD (SUTURE) IMPLANT
SUT VIC AB 2-0 UR6 27 (SUTURE) ×4 IMPLANT
SYR 20ML LL LF (SYRINGE) ×2 IMPLANT
TOWEL OR 17X26 10 PK STRL BLUE (TOWEL DISPOSABLE) ×2 IMPLANT

## 2024-03-01 NOTE — Anesthesia Postprocedure Evaluation (Signed)
 Anesthesia Post Note  Patient: DELAYNEE ALRED  Procedure(s) Performed: REMOVAL, CONDYLOMA HEMORRHOIDECTOMY EXAM UNDER ANESTHESIA, RECTUM     Patient location during evaluation: PACU Anesthesia Type: General Level of consciousness: awake Pain management: pain level controlled Vital Signs Assessment: post-procedure vital signs reviewed and stable Respiratory status: spontaneous breathing and nonlabored ventilation Cardiovascular status: blood pressure returned to baseline Postop Assessment: no apparent nausea or vomiting Anesthetic complications: no Comments: PONV requiring 3 additional agents - Zofran , Barhemsys  and ultimately improved with Phenergan . Recommend using Phenergan  as first line during future anesthetics.   No notable events documented.  Last Vitals:  Vitals:   03/01/24 0933 03/01/24 0945  BP:  (!) 141/92  Pulse: 88 94  Resp: 18 20  Temp:  36.7 C  SpO2: 95% 96%    Last Pain:  Vitals:   03/01/24 1000  TempSrc:   PainSc: 2                  Lauraine KATHEE [place]

## 2024-03-01 NOTE — Transfer of Care (Signed)
 Immediate Anesthesia Transfer of Care Note  Patient: [name]  Procedure(s) Performed: REMOVAL, CONDYLOMA HEMORRHOIDECTOMY EXAM UNDER ANESTHESIA, RECTUM  Patient Location: PACU  Anesthesia Type:General  Level of Consciousness: awake, alert , and oriented  Airway & Oxygen Therapy: Patient Spontanous Breathing and Patient connected to face mask oxygen  Post-op Assessment: Report given to RN and Post -op Vital signs reviewed and stable  Post vital signs: Reviewed and stable  Last Vitals:  Vitals Value Taken Time  BP 153/72 03/01/24 08:38  Temp    Pulse 120 03/01/24 08:39  Resp 36 03/01/24 08:39  SpO2 100 % 03/01/24 08:39  Vitals shown include unfiled device data.  Last Pain:  Vitals:   03/01/24 0559  TempSrc:   PainSc: 0-No pain         Complications: No notable events documented.

## 2024-03-01 NOTE — Op Note (Signed)
 03/01/2024  8:26 AM  PATIENT:  [name]  43 y.o. female  Patient Care Team: Bertell Satterfield, MD as PCP - General (Internal Medicine) Harvey Margo CROME, MD (Inactive) as Consulting Physician (Gastroenterology)  PRE-OPERATIVE DIAGNOSIS:  Perianal lesions, history of squamous cell carcinoma of vulva  POST-OPERATIVE DIAGNOSIS:  Same  PROCEDURE:   Hemorrhoidectomy external - anterior midline, left posterior Anorectal exam under anesthesia  SURGEON:  Surgeon(s): Teresa Lonni HERO, MD  ASSISTANT: OR Staff   ANESTHESIA:   local and general  SPECIMEN:   Perianal lesion - anterior midline Perianal lesion - left posterior  DISPOSITION OF SPECIMEN:  PATHOLOGY  COUNTS:  Sponge, needle, and instrument counts were reported correct x2 at conclusion.  EBL: 10 mL  PLAN OF CARE: Discharge to home after PACU  PATIENT DISPOSITION:  PACU - hemodynamically stable.  OR FINDINGS: Pedunculated/polypoid lesion overlying external hemorrhoid tissue in the anterior midline is excised for pathology.  Additional lesion in the left posterior position is excised similarly.  No other notable findings on circumferential anoscopy with normal-appearing anoderm as well as distal rectal mucosa.  DESCRIPTION: The patient was seen in the pre-op holding area. The risks, benefits, complications, treatment options, and expected outcomes were previously discussed with the patient. The patient agreed with the proposed plan and has signed the informed consent form. The patient was brought to the operating room by the surgical team, identified as [name], and the procedure verified. SCD's were applied. General anesthesia was induced without difficulty.  She was then positioned in lithotomy with Allen stirrups.  Pressure points were evaluated and padded. The patient was then prepped and draped in usual sterile fashion. A time out was completed and the above information confirmed and need for  preoperative antibiotics.  A perianal block was then created using a dilute mixture of 0.25% Marcaine  with epinephrine.  After ascertaining that an appropriate level of anesthesia had been achieved, a well lubricated digital rectal exam was performed. This demonstrated no palpable masses or other notable abnormalities.  A Hill-Ferguson anoscope was into the anal canal and circumferential inspection demonstrated healthy appearing anoderm.  Normal-appearing distal rectal mucosa.  Externally, there are at least 2 suspicious appearing lesions versus tags overlying external hemorrhoidal tissue-1 in the anterior midline the other in the left posterior position.  The largest is in the anterior midline.  The anterior midline lesion is approached first.  This is elevated with a DeBakey forcep and excised fully.  External hemorrhoidal tissue was included with that and dissected free of the underlying sphincter muscle.  No sphincter muscle was divided.  Specimen is passed off.  Hemostasis is achieved electrocautery.  The resulting humeral defect was then closed using interrupted 3-0 chromic sutures.  This was oriented transversely so as not to narrow the anal canal given the somewhat geographical size of the lesion.  Attention is then directed at the left posterior lesion.  This is excised in a very similar fashion.his is elevated with a DeBakey forcep and excised fully.  External hemorrhoidal tissue was included with that and dissected free of the underlying sphincter muscle.  No sphincter muscle was divided.  Specimen is passed off.  Hemostasis is achieved electrocautery.  The resulting humeral defect was then closed using interrupted 3-0 chromic sutures.  This was also oriented transversely so as not to narrow the anal canal given the somewhat geographical size of the lesion.  Additional local anesthetic is an infiltrated at the excision sites.  Hemostasis is verified.  Circumferential anoscopy demonstrates no  apparent narrowing of the anal canal.  All sponge, needle, and instrument counts were reported correct.  A dressing consisting of 4 x 4's, ABD, and mesh underwear was ultimately placed.  She was taken out of lithotomy position, awakened from anesthesia, extubated, and transported to the recovery room in satisfactory condition.  DISPOSITION: PACU in satisfactory condition.

## 2024-03-01 NOTE — Anesthesia Procedure Notes (Signed)
 Procedure Name: LMA Insertion Date/Time: 03/01/2024 7:43 AM  Performed by: Zulema Leita PARAS, CRNAPre-anesthesia Checklist: Patient identified, Emergency Drugs available, Patient being monitored and Suction available Patient Re-evaluated:Patient Re-evaluated prior to induction Oxygen Delivery Method: Circle system utilized Preoxygenation: Pre-oxygenation with 100% oxygen Induction Type: IV induction LMA: LMA with gastric port inserted LMA Size: 4.0 Number of attempts: 1 Placement Confirmation: positive ETCO2 and breath sounds checked- equal and bilateral Tube secured with: Tape Dental Injury: Teeth and Oropharynx as per pre-operative assessment

## 2024-03-01 NOTE — Discharge Instructions (Addendum)

## 2024-03-01 NOTE — Anesthesia Preprocedure Evaluation (Addendum)
 Anesthesia Evaluation  Patient identified by MRN, date of birth, ID band Patient awake    Reviewed: Allergy & Precautions, NPO status , Patient's Chart, lab work & pertinent test results, reviewed documented beta blocker date and time   History of Anesthesia Complications Negative for: history of anesthetic complications  Airway Mallampati: II  TM Distance: >3 FB Neck ROM: Full    Dental  (+) Teeth Intact, Dental Advisory Given   Pulmonary former smoker   breath sounds clear to auscultation       Cardiovascular hypertension, Pt. on medications and Pt. on home beta blockers (-) angina (-) dysrhythmias  Rhythm:Regular Rate:Normal  EKG (02/25/24): NSR   Neuro/Psych  Headaches, Seizures - (Last episode > 10 years ago), Well Controlled,     GI/Hepatic ,GERD  Medicated and Controlled,,  Endo/Other  Hypothyroidism    Renal/GU    Hx SCC of Vulva    Musculoskeletal BMI 31   Abdominal   Peds  Hematology Hgb 13.0, Plts 278K (02/25/24)   Anesthesia Other Findings GLP-1 agonist (last dose - 10/24)  Reproductive/Obstetrics                              Anesthesia Physical Anesthesia Plan  ASA: 2  Anesthesia Plan: General   Post-op Pain Management:    Induction: Intravenous  PONV Risk Score and Plan: 3 and Ondansetron , Dexamethasone , Treatment may vary due to age or medical condition and Midazolam   Airway Management Planned: LMA and Oral ETT  Additional Equipment:   Intra-op Plan:   Post-operative Plan: Extubation in OR  Informed Consent:      Dental advisory given  Plan Discussed with: CRNA  Anesthesia Plan Comments:          Anesthesia Quick Evaluation

## 2024-03-02 ENCOUNTER — Encounter (HOSPITAL_COMMUNITY): Payer: Self-pay | Admitting: Surgery

## 2024-03-02 LAB — SURGICAL PATHOLOGY

## 2024-03-25 ENCOUNTER — Ambulatory Visit: Payer: Self-pay | Admitting: Surgery

## 2024-04-08 ENCOUNTER — Inpatient Hospital Stay: Attending: Gynecologic Oncology | Admitting: Gynecologic Oncology

## 2024-04-08 DIAGNOSIS — C519 Malignant neoplasm of vulva, unspecified: Secondary | ICD-10-CM

## 2024-04-08 NOTE — Progress Notes (Unsigned)
 Gynecologic Oncology Return Clinic Visit  04/08/2024  Reason for Visit: surveillance   Treatment History: The patient presented with a 2-year history of a palpable lesion above her clitoris. 11/05/2020: Exophytic supra clitoral lesion excised in clinic.  Pathology from this noted a 1 x 1 x 0.5 cm lesion, biopsy noted to be superficial and assessment of invasion could not be completed. 11/19/2020: PET shows ill-defined hypermetabolic activity of the vulva.  Nodular cutaneous focus of abnormal hypermetabolic activity along the right anterior labia, nonspecific.  Hypermetabolic 1 cm right thyroid nodule, thyroid ultrasound recommended. 11/22/2020: Partial simple anterior vulvectomy, vulvar biopsies.  Findings at the time of surgery were an 8 x 8 mm area that was mildly erythematous and raised at the site of prior excision, stitch is still in place.  No surrounding induration noted.  2 superficial areas of mild skin discoloration on the right vulva were biopsied. 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  Right vulvar biopsies with no dysplasia or carcinoma, 1 showing ulcer with scale crust and the other acute inflammation with fibrosis. Discussed at tumor board. Given difficulty determining if any invasion, only microscopic focus on re-excision, and PET negative for metastatic disease, recommendation made for close surveillance.  Interval History: ***  Surgery with Dr. Teresa since I saw her last for perianal lesions and hemorrhoids.  Underwent hemorrhoidectomy on 03/01/2024.  Pathology from this revealed anal squamous mucosa with underlying dilated vasculature consistent with hemorrhoid.  No malignancy.  Past Medical/Surgical History: Past Medical History:  Diagnosis Date   Adrenal calcification    bilateal   Cancer (HCC)    squamous cell carcinoma, vulvar ca 2022   COVID 10/10/2020   fever sore throat cough x 8-10-3 days  rapid home test and work pcr done, pt does not have pcr results   Dysrhythmia    followed by PCP in [place]  years ago for palpatations and rate control   GERD (gastroesophageal reflux disease)    Headache    migraines   History of kidney stones    HTN (hypertension)    Hypothyroidism    no meds currently   Insomnia    Pneumonia    Seizures (HCC)    no seizure episodes in years    Past Surgical History:  Procedure Laterality Date   ABDOMINAL HYSTERECTOMY     BILATERAL SALPINGECTOMY Bilateral 12/12/2013   Procedure: BILATERAL SALPINGECTOMY;  Surgeon: Charlie CHRISTELLA Croak, MD;  Location: [facility];  Service: Gynecology;  Laterality: Bilateral;   BREAST CYST EXCISION Left 06/17/2021   Procedure: CYST EXCISION LEFT BREAST;  Surgeon: Vernetta Berg, MD;  Location: [facility];  Service: General;  Laterality: Left;   CONDYLOMA EXCISION/FULGURATION N/A 03/01/2024   Procedure: REMOVAL, CONDYLOMA;  Surgeon: Teresa Lonni CHRISTELLA, MD;  Location: [facility];  Service: General;  Laterality: N/A;  EXCISION OF PERIANAL LESIONS HEMORRHOIDECTOMY (EXTERNAL) ANORECTOL EUA   ESOPHAGOGASTRODUODENOSCOPY N/A 10/01/2015   Dr. Harvey: normal esophagus, mild gastritis, duodenal diverticulum    HEMORRHOID SURGERY N/A 03/01/2024   Procedure: HEMORRHOIDECTOMY;  Surgeon: Teresa Lonni CHRISTELLA, MD;  Location: [facility];  Service: General;  Laterality: N/A;   LAPAROSCOPIC ASSISTED VAGINAL HYSTERECTOMY N/A 12/12/2013   uterine fibroids   LEEP  2004   RECTAL EXAM UNDER ANESTHESIA N/A 03/01/2024   Procedure: EXAM UNDER ANESTHESIA, RECTUM;  Surgeon: Teresa Lonni CHRISTELLA, MD;  Location: [facility];  Service: General;  Laterality: N/A;   VULVECTOMY N/A 11/22/2020   Procedure:  WIDE EXCISION VULVECTOMY;  Surgeon: Viktoria Comer SAUNDERS, MD;  Location: [facility];  Service: Gynecology;  Laterality: N/A;    Family History  Problem Relation Age of Onset   Cancer Father        Unsure of type of cancer    Hypertension Father    Diabetes Father    Breast cancer Maternal Aunt 30   Stomach cancer Paternal Grandmother    Colon cancer Neg Hx    Endometrial cancer Neg Hx    Pancreatic cancer Neg Hx    Prostate cancer Neg Hx     Social History   Socioeconomic History   Marital status: Married    Spouse name: Not on file   Number of children: Not on file   Years of education: Not on file   Highest education level: Not on file  Occupational History   Occupation: RN    Comment: [employer]   Tobacco Use   Smoking status: Former    Current packs/day: 0.00    Types: Cigarettes    Start date: 1998    Quit date: 2008    Years since quitting: 17.9   Smokeless tobacco: Never  Vaping Use   Vaping status: Never Used  Substance and Sexual Activity   Alcohol use: No    Alcohol/week: 0.0 standard drinks of alcohol   Drug use: No   Sexual activity: Yes    Birth control/protection: Surgical  Other Topics Concern   Not on file  Social History Narrative   Not on file   Social Drivers of Health   Tobacco Use: Medium Risk (03/01/2024)   Patient History    Smoking Tobacco Use: Former    Smokeless Tobacco Use: Never    Passive Exposure: Not on Actuary Strain: Not on file  Food Insecurity: Not on file  Transportation Needs: Not on file  Physical Activity: Not on file  Stress: Not on file  Social Connections: Not on file  Depression (EYV7-0): Not on file  Alcohol Screen: Not on file  Housing: Unknown (01/05/2024)   Received from [facility]   Epic    Unable to Pay for Housing in the Last Year: Not on file    Number of Times Moved in the Last Year: Not on file    At any time in the past 12 months, were you homeless or living in a shelter (including now)?: No  Utilities: Not on file  Health Literacy: Not on file    Current Medications: Current Medications[1]  Review of Systems: Denies appetite changes, fevers, chills, fatigue, unexplained  weight changes. Denies hearing loss, neck lumps or masses, mouth sores, ringing in ears or voice changes. Denies cough or wheezing.  Denies shortness of breath. Denies chest pain or palpitations. Denies leg swelling. Denies abdominal distention, pain, blood in stools, constipation, diarrhea, nausea, vomiting, or early satiety. Denies pain with intercourse, dysuria, frequency, hematuria or incontinence. Denies hot flashes, pelvic pain, vaginal bleeding or vaginal discharge.   Denies joint pain, back pain or muscle pain/cramps. Denies itching, rash, or wounds. Denies dizziness, headaches, numbness or seizures. Denies swollen lymph nodes or glands, denies easy bruising or bleeding. Denies anxiety, depression, confusion, or decreased concentration.  Physical Exam: LMP 05/05/2013  General: ***Alert, oriented, no acute distress. HEENT: ***Posterior oropharynx clear, sclera anicteric. Chest: ***Clear to auscultation bilaterally.  ***Port site clean. Cardiovascular: ***Regular rate and rhythm, no murmurs. Abdomen: ***Obese, soft, nontender.  Normoactive bowel sounds.  No masses  or hepatosplenomegaly appreciated.  ***Well-healed scar. Extremities: ***Grossly normal range of motion.  Warm, well perfused.  No edema bilaterally. Skin: ***No rashes or lesions noted. Lymphatics: ***No cervical, supraclavicular, or inguinal adenopathy. GU: Normal appearing external genitalia without erythema, excoriation, or lesions.  Speculum exam reveals ***.  Bimanual exam reveals ***.  ***Rectovaginal exam  confirms ___.  Laboratory & Radiologic Studies: ***  Assessment & Plan: [name] is a 43 y.o. 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 Surgical excision was in 11/2020.   Patient is doing well, NED on exam today.   Per NCCN surveillance recommendations, since she is now more than 3 years out from definitive  treatment, we will transition to yearly visits. We reviewed signs and symptoms that would be concerning for disease recurrence, the patient knows to call if she develops any of these between visits.  *** minutes of total time was spent for this patient encounter, including preparation, face-to-face counseling with the patient and coordination of care, and documentation of the encounter.  Comer Dollar, MD  Division of Gynecologic Oncology  Department of Obstetrics and Gynecology  [facility]      [1]  Current Outpatient Medications:    acetaminophen  (TYLENOL ) 325 MG tablet, Take 650 mg by mouth every 6 (six) hours as needed for moderate pain., Disp: , Rfl:    atenolol (TENORMIN) 25 MG tablet, Take 25 mg by mouth daily., Disp: , Rfl:    furosemide (LASIX) 20 MG tablet, Take 20-40 mg by mouth daily., Disp: , Rfl:    ibuprofen  (ADVIL ) 200 MG tablet, Take 200-400 mg by mouth every 6 (six) hours as needed for mild pain (pain score 1-3) or headache., Disp: , Rfl:    LamoTRIgine  100 MG TB24 24 hour tablet, Take 100 mg by mouth daily., Disp: , Rfl:    Metamucil Fiber 2 g CHEW, Chew 1-3 Doses by mouth daily., Disp: , Rfl:    omeprazole (PRILOSEC) 20 MG capsule, Take 20 mg by mouth daily., Disp: , Rfl:    ondansetron  (ZOFRAN ) 4 MG tablet, Take 4 mg by mouth every 8 (eight) hours as needed for nausea or vomiting., Disp: , Rfl:    SEMAGLUTIDE [place], Inject 48 Units into the skin every 14 (fourteen) days. Semaglutide GLP-1s: 2.5mg /mL (Prescribed, compounded, and shipped by [facility]), Disp: , Rfl:    topiramate (TOPAMAX) 50 MG tablet, Take 50 mg by mouth daily. Once daily before sleep, Disp: , Rfl:    zolpidem  (AMBIEN ) 10 MG tablet, Take 10 mg by mouth at bedtime., Disp: , Rfl:

## 2024-05-24 ENCOUNTER — Encounter: Payer: Self-pay | Admitting: Internal Medicine
